# Patient Record
Sex: Female | Born: 1950 | Race: White | Hispanic: No | Marital: Married | Smoking: Never smoker
Health system: Southern US, Community
[De-identification: ages and names within clinical notes are randomized; demographics above are authoritative.]

## PROBLEM LIST (undated history)

## (undated) DIAGNOSIS — M1990 Unspecified osteoarthritis, unspecified site: Secondary | ICD-10-CM

## (undated) DIAGNOSIS — F329 Major depressive disorder, single episode, unspecified: Secondary | ICD-10-CM

## (undated) DIAGNOSIS — J189 Pneumonia, unspecified organism: Secondary | ICD-10-CM

## (undated) DIAGNOSIS — N8502 Endometrial intraepithelial neoplasia [EIN]: Secondary | ICD-10-CM

## (undated) DIAGNOSIS — K219 Gastro-esophageal reflux disease without esophagitis: Secondary | ICD-10-CM

## (undated) DIAGNOSIS — D219 Benign neoplasm of connective and other soft tissue, unspecified: Secondary | ICD-10-CM

## (undated) DIAGNOSIS — E785 Hyperlipidemia, unspecified: Secondary | ICD-10-CM

## (undated) DIAGNOSIS — K581 Irritable bowel syndrome with constipation: Secondary | ICD-10-CM

## (undated) DIAGNOSIS — E7800 Pure hypercholesterolemia, unspecified: Secondary | ICD-10-CM

## (undated) DIAGNOSIS — M359 Systemic involvement of connective tissue, unspecified: Secondary | ICD-10-CM

## (undated) DIAGNOSIS — F32A Depression, unspecified: Secondary | ICD-10-CM

## (undated) DIAGNOSIS — M5030 Other cervical disc degeneration, unspecified cervical region: Secondary | ICD-10-CM

## (undated) DIAGNOSIS — C801 Malignant (primary) neoplasm, unspecified: Secondary | ICD-10-CM

## (undated) DIAGNOSIS — M8589 Other specified disorders of bone density and structure, multiple sites: Secondary | ICD-10-CM

## (undated) DIAGNOSIS — H9190 Unspecified hearing loss, unspecified ear: Secondary | ICD-10-CM

## (undated) HISTORY — PX: TONSILLECTOMY: SUR1361

## (undated) HISTORY — PX: ESOPHAGOGASTRODUODENOSCOPY: SHX1529

## (undated) HISTORY — PX: ABDOMINAL HYSTERECTOMY: SHX81

## (undated) HISTORY — PX: COLONOSCOPY: SHX174

## (undated) HISTORY — PX: TUBAL LIGATION: SHX77

---

## 2005-02-11 ENCOUNTER — Ambulatory Visit: Payer: Self-pay | Admitting: Internal Medicine

## 2005-06-17 ENCOUNTER — Ambulatory Visit: Payer: Self-pay | Admitting: Gastroenterology

## 2006-03-21 ENCOUNTER — Ambulatory Visit: Payer: Self-pay | Admitting: Internal Medicine

## 2007-03-27 ENCOUNTER — Ambulatory Visit: Payer: Self-pay | Admitting: Family Medicine

## 2007-06-06 ENCOUNTER — Ambulatory Visit: Payer: Self-pay | Admitting: Orthopaedic Surgery

## 2008-03-28 ENCOUNTER — Ambulatory Visit: Payer: Self-pay | Admitting: Internal Medicine

## 2009-03-31 ENCOUNTER — Ambulatory Visit: Payer: Self-pay | Admitting: Family Medicine

## 2009-05-18 ENCOUNTER — Ambulatory Visit: Payer: Self-pay | Admitting: Internal Medicine

## 2009-07-08 ENCOUNTER — Ambulatory Visit: Payer: Self-pay | Admitting: Family Medicine

## 2010-04-06 ENCOUNTER — Ambulatory Visit: Payer: Self-pay | Admitting: Family Medicine

## 2010-06-29 ENCOUNTER — Ambulatory Visit: Payer: Self-pay | Admitting: Gastroenterology

## 2011-04-21 ENCOUNTER — Ambulatory Visit: Payer: Self-pay | Admitting: Family Medicine

## 2011-05-19 ENCOUNTER — Ambulatory Visit: Payer: Self-pay | Admitting: Gastroenterology

## 2012-04-12 ENCOUNTER — Ambulatory Visit: Payer: Self-pay | Admitting: Family Medicine

## 2012-04-26 ENCOUNTER — Ambulatory Visit: Payer: Self-pay | Admitting: Family Medicine

## 2012-09-20 HISTORY — PX: REPLACEMENT TOTAL KNEE: SUR1224

## 2012-09-20 HISTORY — PX: JOINT REPLACEMENT: SHX530

## 2012-10-30 ENCOUNTER — Ambulatory Visit: Payer: Self-pay | Admitting: General Practice

## 2012-10-30 LAB — SEDIMENTATION RATE: Erythrocyte Sed Rate: 10 mm/hr (ref 0–30)

## 2012-10-30 LAB — BASIC METABOLIC PANEL
BUN: 14 mg/dL (ref 7–18)
Calcium, Total: 8.9 mg/dL (ref 8.5–10.1)
Chloride: 106 mmol/L (ref 98–107)
Creatinine: 0.78 mg/dL (ref 0.60–1.30)
EGFR (African American): >60
EGFR (Non-African Amer.): >60
Glucose: 84 mg/dL (ref 65–99)
Sodium: 141 mmol/L (ref 136–145)

## 2012-10-30 LAB — CBC
HGB: 13.4 g/dL (ref 12.0–16.0)
MCHC: 34.2 g/dL (ref 32.0–36.0)
MCV: 92 fL (ref 80–100)
RBC: 4.28 10*6/uL (ref 3.80–5.20)
WBC: 5.3 10*3/uL (ref 3.6–11.0)

## 2012-10-30 LAB — URINALYSIS, COMPLETE
Bilirubin,UR: NEGATIVE
Leukocyte Esterase: NEGATIVE
Nitrite: NEGATIVE
Ph: 5 (ref 4.5–8.0)
Protein: NEGATIVE
Specific Gravity: 1.025 (ref 1.003–1.030)
Squamous Epithelial: <1
WBC UR: 1 /HPF (ref 0–5)

## 2012-10-30 LAB — PROTIME-INR: Prothrombin Time: 12.1 secs (ref 11.5–14.7)

## 2012-10-30 LAB — MRSA PCR SCREENING

## 2012-10-30 LAB — APTT: Activated PTT: 30 secs (ref 23.6–35.9)

## 2012-11-13 ENCOUNTER — Inpatient Hospital Stay: Payer: Self-pay | Admitting: General Practice

## 2012-11-14 LAB — BASIC METABOLIC PANEL
Anion Gap: 6 — ABNORMAL LOW (ref 7–16)
BUN: 13 mg/dL (ref 7–18)
Chloride: 107 mmol/L (ref 98–107)
EGFR (Non-African Amer.): >60
Glucose: 84 mg/dL (ref 65–99)

## 2012-11-14 LAB — PLATELET COUNT: Platelet: 172 10*3/uL (ref 150–440)

## 2012-11-15 LAB — BASIC METABOLIC PANEL
Anion Gap: 7 (ref 7–16)
BUN: 13 mg/dL (ref 7–18)
Chloride: 102 mmol/L (ref 98–107)
Co2: 26 mmol/L (ref 21–32)
Creatinine: 0.81 mg/dL (ref 0.60–1.30)
EGFR (Non-African Amer.): >60
Osmolality: 271 (ref 275–301)
Potassium: 4.6 mmol/L (ref 3.5–5.1)
Sodium: 135 mmol/L — ABNORMAL LOW (ref 136–145)

## 2012-11-15 LAB — PLATELET COUNT: Platelet: 201 10*3/uL (ref 150–440)

## 2013-04-27 ENCOUNTER — Ambulatory Visit: Payer: Self-pay | Admitting: Family Medicine

## 2014-05-01 ENCOUNTER — Ambulatory Visit: Payer: Self-pay | Admitting: Family Medicine

## 2014-06-28 DIAGNOSIS — K581 Irritable bowel syndrome with constipation: Secondary | ICD-10-CM | POA: Insufficient documentation

## 2015-01-10 NOTE — Discharge Summary (Signed)
PATIENT NAME:  Holly Mclaughlin, Holly Mclaughlin MR#:  [id number] DATE OF BIRTH:  09/26/1950  DATE OF ADMISSION:  11/13/2012 DATE OF DISCHARGE:  11/16/2012  ADMITTING DIAGNOSIS: Degenerative arthrosis of left knee.   DISCHARGE DIAGNOSES: Degenerative arthrosis of left knee.   HISTORY: The patient is a 64 year old female who has been followed at [facility] for progression of left knee pain. She had reported a long history of progressive knee pain. She did not recall any specific trauma or aggravating event. The patient had localized most of the pain along the medial aspect of the knee. Her pain was noted to be aggravated with weight-bearing activities. At the time of surgery, she was not using any ambulatory aid. The patient had not seen any significant improvement in her condition despite a series of Synvisc injections as well as Voltaren gel. X-rays taken in the [facility] showed significant degenerative changes with relative varus deformity. After discussion of the risks and benefits of surgical intervention, the patient expressed her understanding of the risks and benefits and agreed with plans for surgical intervention.   PROCEDURE: Left total knee arthroplasty using computer-assisted navigation.   ANESTHESIA: Femoral nerve block with spinal.   SOFT TISSUE RELEASE: Anterior cruciate ligament, posterior cruciate ligament, deep medial collateral ligaments, as well as the patellofemoral ligament.   IMPLANTS UTILIZED: DePuy PFC Sigma size 2.5 posterior stabilized femoral component (cemented), size 2.5 MBT tibial component (cemented), 32 mm three pegged oval dome patella (cemented), and a 10 mm stabilized rotating platform polyethylene insert.   HOSPITAL COURSE: The patient tolerated the procedure very well. She had no complications. She was then taken to PAC-U where she was stabilized and then transferred to the orthopedic floor. She began receiving anticoagulation therapy of Lovenox 30 mg sub-Q every 12  hours per anesthesia and pharmacy protocol. She was fitted with TED stockings bilaterally. These were allowed to be removed 1 hour per 8 hour shift. The left one was applied on day 2 following removal of the Hemovac and dressing change. The patient was also fitted with the AV-I compression foot pumps bilaterally set at 80 mmHg. There has been no evidence of any DVTs. Calves have been nontender. Negative Homans sign. Heels were elevated off the bed using rolled towels. No tissue breakdown was noted.   The patient has denied any chest pains or shortness of breath. Vital signs have been stable. She has been afebrile. Hemodynamically she was stable and no transfusions were given other than the Autovac transfusion given the first 6 hours postoperatively.   Physical therapy was initiated on day 1 for gait training and transfers. She has done very well. Upon being discharged was ambulating greater than 200 feet. She was able to go up and down 4 sets of steps. She was independent with bed to chair transfers. Occupational therapy was also initiated on day 1 for ADL and assistive devices.   The patient's IV, Foley and Hemovac were discontinued on day 2 along with a dressing change. The Polar Care was reapplied to the surgical leg maintaining a temperature of 40 to 50 degrees Fahrenheit. The wound was free of any drainage or any signs of infection.   DISPOSITION: The patient is discharged to home in improved stable condition.   DISCHARGE INSTRUCTIONS: 1.  She may continue to weight bear as tolerated. Continue using a walker until cleared by physical therapy to go to a quad cane. She will receive home PT.  2.  Continue with TED stockings. These are to be  worn during the day but may be removed at night.  3.  Continue with the Polar Care maintaining a temperature of 40 to 50 degrees Fahrenheit pretty much around-the-clock for the first 2 weeks.  4.  She has a followup appointment in the clinic in 2 weeks. She is to  call the clinic sooner if any temperatures of 101.5 or greater or excessive bleeding.  5.  She is placed on a regular diet.  6.  She is to resume her regular medication that she was on prior to admission. She was given a prescription for oxycodone 5 to 10 mg q. 4 to 6 hours p.r.n. for pain, tramadol 50 to 100 mg q. 4 to 6 hours p.r.n. for pain, Lovenox 40 mg sub-Q daily for 14 days and then discontinue and begin taking one 81 mg enteric-coated aspirin.   PAST MEDICAL HISTORY: 1.  Chemotherapy. 2.  Depression.    3.  Hypercholesterolemia.  4.  Arthritis.  ____________________________ Vance Peper, PA jrw:[id number] D: 11/16/2012 07:25:06 ET T: 11/16/2012 07:37:17 ET JOB#: [id number]  cc: Vance Peper, PA, <Dictator> Corey Caulfield PA ELECTRONICALLY SIGNED 11/16/2012 21:08

## 2015-01-10 NOTE — Op Note (Signed)
PATIENT NAME:  Holly Mclaughlin, Holly Mclaughlin MR#:  [id number] DATE OF BIRTH:  [birth year]  DATE OF PROCEDURE:  11/13/2012  PREOPERATIVE DIAGNOSIS: Degenerative arthrosis of the left knee.   POSTOPERATIVE DIAGNOSIS: Degenerative arthrosis of the left knee.   PROCEDURE PERFORMED: Left total knee arthroplasty using computer-assisted navigation.   SURGEON: Dr. Skip Estimable.   ASSISTANT: Vance Peper, PA.   ANESTHESIA: Femoral nerve block, and spinal.   ESTIMATED BLOOD LOSS: 50 mL.   FLUIDS REPLACED: 2000 mL of crystalloid.   TOURNIQUET TIME: 80 minutes.   DRAINS: Two medium drains to reinfusion system.   SOFT TISSUE RELEASES: Anterior cruciate ligament, posterior cruciate ligament, deep medial collateral ligament, patellofemoral ligament.   IMPLANTS UTILIZED: DePuy PFC Sigma size 2.5 posterior-stabilized femoral component (size 2.5 MBT tibial component) (cemented), 32-mm three-pegged oval-dome patella (cemented), and a 10-mm stabilized rotating platform polyethylene insert.   INDICATIONS FOR SURGERY: The patient is a 64 year old female who has been seen for complaints of progressive left knee pain. X-rays demonstrated significant degenerative changes with relative varus deformity. The patient has not seen any significant improvement despite conservative nonsurgical intervention.   After a discussion of the risks and benefits of surgical intervention, the patient expressed understanding of the risks and benefits and agreed with plans for surgical intervention.   PROCEDURE IN DETAIL: The patient was brought into the operating room, then after adequate femoral nerve block and spinal anesthesia was achieved, a tourniquet was placed on the patient's upper left thigh. The patient's left knee and leg were cleaned and prepped with alcohol and DuraPrep, draped in the usual sterile fashion. A "timeout" was performed as per usual protocol. The left lower extremity was exsanguinated using an Esmarch, and the tourniquet  was inflated to 300 mmHg. An anterior longitudinal incision was made, followed by a standard mid-vastus approach. A moderate effusion was evacuated. The deep fibers of the medial collateral ligament were elevated in a subperiosteal fashion off the medial flare of the tibia so as to maintain a continuous soft tissue sleeve. The patella was subluxed laterally, and the patellofemoral ligament was incised. Inspection of the knee demonstrated severe degenerative changes with full-thickness loss of articular cartilage to the medial compartment. Prominent osteophytes were debrided using a rongeur. Anterior and posterior cruciate ligaments were excised.   Two 4.0-mm  Schanz pins were inserted into the femur and into the tibia for attachment of the array of trackers used for computer-assisted navigation. Hip center was identified using circumduction technique. Distal landmarks were mapped using the computer. The distal femur and proximal tibia were mapped using the computer. Distal femoral cutting guide was positioned using computer-assisted navigation so as to achieve a 5 degree distal valgus cut. Cut was performed and verified using the computer. The distal femur was sized and it was felt that a size 2.5 femoral component was appropriate. A size 2.5 cutting guide was positioned and an anterior cut was performed and verified using the computer. This was followed by completion of the posterior and chamfer cuts. The femoral cutting guide for the central box was then positioned, and the central box cut was performed.   Attention was then directed to the proximal tibia. Medial and lateral menisci were excised. The extramedullary tibial cutting guide was positioned using computer-assisted navigation so as to achieve 0 degrees varus-valgus alignment and 0 degree posterior slope. Cut was performed and verified using the computer. The proximal tibia was sized, and it was felt that a size 2.5 tibial tray was appropriate. Tibial  and  femoral trials were inserted, followed by insertion of a 10-mm  polyethylene insert. Good medial and lateral soft tissue balancing was appreciated both in full extension and in flexion.   Finally, the patella was cut and prepared so as to accommodate a 32-mm  three-peg oval dome patella. A patellar trial was placed and the knee was placed through a range of motion, with excellent patellar tracking appreciated.   Femoral component was removed. Central post hole for the tibial component was reamed, followed by insertion of a keel punch. The tibial tray was then removed. The cut surfaces of bone were irrigated with copious amounts of normal saline with antibiotic solution using pulsatile lavage and then suctioned dry. Poly (methyl) (methacrylate) cement was prepared in the usual fashion using a vacuum mixer. Cement was applied to the cut surface of the proximal tibia as well as along the undersurface of a size 2.5 MBT tibial component. The tibial component was positioned and impacted into place. Excess cement was removed using [employer] fast streamer. Cement was then applied to the cut surface of the femur as well as along the posterior flanges of a size 2.5 posterior-stabilized femoral component. Femoral component was positioned and impacted into place. Excess cement was removed using [employer] fast streamer. A 10-mm  polyethylene trial was inserted and the knee was brought in full extension with steady axial compression applied. Finally, cement was applied to the backside of a 32-mm three-peg oval dome patella, and the patellar component was positioned and patellar clamp applied. Excess cement was removed using [employer] fast streamer.   After adequate curing of cement, the tourniquet was deflated after a total tourniquet time of  80 minutes. Hemostasis was achieved using electrocautery. The knee was irrigated with copious amounts of normal saline with antibiotic solution using pulsatile lavage, and then suctioned dry. The knee  was inspected for any residual cement debris. A 30 mL of 0.25% Marcaine with epinephrine was injected along the posterior capsule. A 10-mm stabilized rotating-platform polyethylene insert was inserted and the knee was placed through a range of motion. Excellent patellar tracking was appreciated. Excellent medial and lateral soft tissue balancing was noted.   Two medium drains were placed in the wound bed and brought out through a separate stab incision to be attached to a reinfusion system. The medial parapatellar portion of the incision was reapproximated using interrupted sutures of #1 Vicryl. The subcutaneous tissue was approximated in layers using, first, #0 Vicryl followed by #2-0 Vicryl. Skin was closed with skin staples. A sterile dressing was applied.   The patient tolerated the procedure well. She was transported to the recovery room in stable condition.     ____________________________ Laurice Record. Holley Bouche., MD jph:dm D: 11/13/2012 11:38:00 ET T: 11/13/2012 12:56:14 ET JOB#: [id number]  cc: Jeneen Rinks P. Holley Bouche., MD, <Dictator> JAMES P Holley Bouche MD ELECTRONICALLY SIGNED 11/14/2012 [phone]

## 2015-05-22 ENCOUNTER — Other Ambulatory Visit: Payer: Self-pay | Admitting: Family Medicine

## 2015-05-22 DIAGNOSIS — Z1231 Encounter for screening mammogram for malignant neoplasm of breast: Secondary | ICD-10-CM

## 2015-06-12 ENCOUNTER — Ambulatory Visit
Admission: RE | Admit: 2015-06-12 | Discharge: 2015-06-12 | Disposition: A | Discharge status: Home / Self Care | Payer: BLUE CROSS/BLUE SHIELD | Source: Ambulatory Visit | Attending: Family Medicine | Admitting: Family Medicine

## 2015-06-12 DIAGNOSIS — R928 Other abnormal and inconclusive findings on diagnostic imaging of breast: Secondary | ICD-10-CM | POA: Diagnosis not present

## 2015-06-12 DIAGNOSIS — Z1231 Encounter for screening mammogram for malignant neoplasm of breast: Secondary | ICD-10-CM | POA: Diagnosis present

## 2015-06-18 ENCOUNTER — Other Ambulatory Visit: Payer: Self-pay | Admitting: Family Medicine

## 2015-06-18 DIAGNOSIS — N6489 Other specified disorders of breast: Secondary | ICD-10-CM

## 2015-06-26 ENCOUNTER — Ambulatory Visit
Admission: RE | Admit: 2015-06-26 | Discharge: 2015-06-26 | Disposition: A | Discharge status: Home / Self Care | Payer: BLUE CROSS/BLUE SHIELD | Source: Ambulatory Visit | Attending: Family Medicine | Admitting: Family Medicine

## 2015-06-26 DIAGNOSIS — N6489 Other specified disorders of breast: Secondary | ICD-10-CM | POA: Diagnosis present

## 2015-06-26 DIAGNOSIS — R928 Other abnormal and inconclusive findings on diagnostic imaging of breast: Secondary | ICD-10-CM | POA: Insufficient documentation

## 2015-07-17 ENCOUNTER — Ambulatory Visit
Admission: EM | Admit: 2015-07-17 | Discharge: 2015-07-17 | Disposition: A | Discharge status: Home / Self Care | Payer: BLUE CROSS/BLUE SHIELD | Attending: Family Medicine | Admitting: Family Medicine

## 2015-07-17 DIAGNOSIS — R319 Hematuria, unspecified: Secondary | ICD-10-CM | POA: Diagnosis not present

## 2015-07-17 DIAGNOSIS — N390 Urinary tract infection, site not specified: Secondary | ICD-10-CM

## 2015-07-17 HISTORY — DX: Gastro-esophageal reflux disease without esophagitis: K21.9

## 2015-07-17 LAB — URINALYSIS COMPLETE WITH MICROSCOPIC (ARMC ONLY)
Glucose, UA: NEGATIVE mg/dL
Nitrite: NEGATIVE
PH: 5.5 (ref 5.0–8.0)
SQUAMOUS EPITHELIAL / LPF: NONE SEEN — AB
Specific Gravity, Urine: >1.03 — ABNORMAL HIGH (ref 1.005–1.030)

## 2015-07-17 MED ORDER — PHENAZOPYRIDINE HCL 200 MG PO TABS: 200.0000 mg | ORAL_TABLET | Freq: Three times a day (TID) | ORAL | Status: DC | PRN

## 2015-07-17 MED ORDER — CIPROFLOXACIN HCL 500 MG PO TABS: 500.0000 mg | ORAL_TABLET | Freq: Two times a day (BID) | ORAL | Status: DC

## 2015-07-17 NOTE — ED Notes (Signed)
Pt states "This evening I noticed blood in my urine with stinging."

## 2015-07-17 NOTE — ED Provider Notes (Signed)
CSN: [id number]     Arrival date & time 07/17/15  1644 History   First MD Initiated Contact with Patient 07/17/15 1727    Nurses notes were reviewed. Chief Complaint  Patient presents with  . Hematuria   patient states she started having burning on urination after taking her mother-in-law to her doctor's appointment. Not only she have dysuria but she noticed blood in the urine as well. She does get urinary tract infections often but she has had them before and this is how they felt. She denies any pain except when she is urinating. Denies dyspareunia, discharge or vaginal bleeding. Surgeries tubal ligation and knee replacement surgery. (Consider location/radiation/quality/duration/timing/severity/associated sxs/prior Treatment) Patient is a 64 y.o. female presenting with hematuria. The history is provided by the patient. No language interpreter was used.  Hematuria This is a new problem. The current episode started more than 1 week ago. The problem has been gradually worsening. Associated symptoms include abdominal pain. Pertinent negatives include no chest pain and no headaches. Nothing aggravates the symptoms. Nothing relieves the symptoms. The treatment provided no relief.   Patient never smoked. Past Medical History  Diagnosis Date  . GERD (gastroesophageal reflux disease)    Past Surgical History  Procedure Laterality Date  . Joint replacement     Family History  Problem Relation Age of Onset  . Breast cancer Paternal Aunt 53  . Breast cancer Paternal Grandmother 29  . Breast cancer Cousin     2 pat cousins   Social History  Substance Use Topics  . Smoking status: Never Smoker   . Smokeless tobacco: None  . Alcohol Use: Yes     Comment: social   OB History    No data available     Review of Systems  Cardiovascular: Negative for chest pain.  Gastrointestinal: Positive for abdominal pain.  Genitourinary: Positive for dysuria and hematuria. Negative for flank pain,  enuresis and dyspareunia.  Musculoskeletal: Negative.   Neurological: Negative for headaches.    Allergies  Review of patient's allergies indicates not on file.  Home Medications   Prior to Admission medications   Medication Sig Start Date End Date Taking? Authorizing Provider  ciprofloxacin (CIPRO) 500 MG tablet Take 1 tablet (500 mg total) by mouth 2 (two) times daily. 07/17/15   Frederich Cha, MD  phenazopyridine (PYRIDIUM) 200 MG tablet Take 1 tablet (200 mg total) by mouth 3 (three) times daily as needed for pain. 07/17/15   Frederich Cha, MD   Meds Ordered and Administered this Visit  Medications - No data to display  BP 132/57 mmHg  Pulse 72  Temp(Src) 97.7 F (36.5 C) (Tympanic)  Resp 16  Ht 5' (1.524 m)  Wt 182 lb (82.555 kg)  BMI 35.54 kg/m2  SpO2 100% No data found.   Physical Exam  Constitutional: She is oriented to person, place, and time. She appears well-developed and well-nourished.  HENT:  Head: Normocephalic and atraumatic.  Right Ear: External ear normal.  Left Ear: External ear normal.  Eyes: Pupils are equal, round, and reactive to light.  Neck: Neck supple.  Abdominal: Soft. Bowel sounds are normal. She exhibits no distension. There is no hepatosplenomegaly. There is tenderness in the suprapubic area. There is no rebound and no CVA tenderness. No hernia.    Musculoskeletal: Normal range of motion. She exhibits no edema.  Neurological: She is alert and oriented to person, place, and time.  Skin: Skin is warm, dry and intact. No erythema.  Psychiatric: She has  a normal mood and affect. Her speech is normal and behavior is normal. Her mood appears not anxious. Cognition and memory are normal. Cognition and memory are not impaired.  Vitals reviewed.   ED Course  Procedures (including critical care time)  Labs Review Labs Reviewed  URINALYSIS COMPLETEWITH MICROSCOPIC ([facility] ONLY) - Abnormal; Notable for the following:    Color, Urine AMBER (*)     APPearance CLOUDY (*)    Bilirubin Urine 1+ (*)    Ketones, ur TRACE (*)    Specific Gravity, Urine >1.030 (*)    Hgb urine dipstick 3+ (*)    Protein, ur >300 (*)    Leukocytes, UA 3+ (*)    Bacteria, UA MANY (*)    Squamous Epithelial / LPF NONE SEEN (*)    All other components within normal limits  URINE CULTURE    Imaging Review No results found.   Visual Acuity Review  Right Eye Distance:   Left Eye Distance:   Bilateral Distance:    Right Eye Near:   Left Eye Near:    Bilateral Near:      Results for orders placed or performed during the hospital encounter of 07/17/15  Urinalysis complete, with microscopic  Result Value Ref Range   Color, Urine AMBER (A) YELLOW   APPearance CLOUDY (A) CLEAR   Glucose, UA NEGATIVE NEGATIVE mg/dL   Bilirubin Urine 1+ (A) NEGATIVE   Ketones, ur TRACE (A) NEGATIVE mg/dL   Specific Gravity, Urine >1.030 (H) 1.005 - 1.030   Hgb urine dipstick 3+ (A) NEGATIVE   pH 5.5 5.0 - 8.0   Protein, ur >300 (A) NEGATIVE mg/dL   Nitrite NEGATIVE NEGATIVE   Leukocytes, UA 3+ (A) NEGATIVE   RBC / HPF TOO NUMEROUS TO COUNT <3 RBC/hpf   WBC, UA TOO NUMEROUS TO COUNT <3 WBC/hpf   Bacteria, UA MANY (A) RARE   Squamous Epithelial / LPF NONE SEEN (A) RARE    MDM   1. Hematuria   2. UTI (lower urinary tract infection)     I saw on Cipro 500 mg twice a day for 7 days and Pyridium 500 mg one by mouth 3 times a day for the next 5 days. I did recommend going to doctor in 2-3 weeks for follow-up to make sure that the hematuria has cleared up.   Frederich Cha, MD 07/17/15 [phone]

## 2015-07-17 NOTE — Discharge Instructions (Signed)
Hematuria, Adult Hematuria is blood in your urine. It can be caused by a bladder infection, kidney infection, prostate infection, kidney stone, or cancer of your urinary tract. Infections can usually be treated with medicine, and a kidney stone usually will pass through your urine. If neither of these is the cause of your hematuria, further workup to find out the reason may be needed. It is very important that you tell your health care provider about any blood you see in your urine, even if the blood stops without treatment or happens without causing pain. Blood in your urine that happens and then stops and then happens again can be a symptom of a very serious condition. Also, pain is not a symptom in the initial stages of many urinary cancers. HOME CARE INSTRUCTIONS   Drink lots of fluid, 3-4 quarts a day. If you have been diagnosed with an infection, cranberry juice is especially recommended, in addition to large amounts of water.  Avoid caffeine, tea, and carbonated beverages because they tend to irritate the bladder.  Avoid alcohol because it may irritate the prostate.  Take all medicines as directed by your health care provider.  If you were prescribed an antibiotic medicine, finish it all even if you start to feel better.  If you have been diagnosed with a kidney stone, follow your health care provider's instructions regarding straining your urine to catch the stone.  Empty your bladder often. Avoid holding urine for long periods of time.  After a bowel movement, women should cleanse front to back. Use each tissue only once.  Empty your bladder before and after sexual intercourse if you are a female. SEEK MEDICAL CARE IF:  You develop back pain.  You have a fever.  You have a feeling of sickness in your stomach (nausea) or vomiting.  Your symptoms are not better in 3 days. Return sooner if you are getting worse. SEEK IMMEDIATE MEDICAL CARE IF:   You develop severe vomiting and  are unable to keep the medicine down.  You develop severe back or abdominal pain despite taking your medicines.  You begin passing a large amount of blood or clots in your urine.  You feel extremely weak or faint, or you pass out. MAKE SURE YOU:   Understand these instructions.  Will watch your condition.  Will get help right away if you are not doing well or get worse.   This information is not intended to replace advice given to you by your health care provider. Make sure you discuss any questions you have with your health care provider.   Document Released: 09/06/2005 Document Revised: 09/27/2014 Document Reviewed: 05/07/2013 Elsevier Interactive Patient Education 2016 [employer]  Urinary Tract Infection A urinary tract infection (UTI) can occur any place along the urinary tract. The tract includes the kidneys, ureters, bladder, and urethra. A type of germ called bacteria often causes a UTI. UTIs are often helped with antibiotic medicine.  HOME CARE   If given, take antibiotics as told by your doctor. Finish them even if you start to feel better.  Drink enough fluids to keep your pee (urine) clear or pale yellow.  Avoid tea, drinks with caffeine, and bubbly (carbonated) drinks.  Pee often. Avoid holding your pee in for a long time.  Pee before and after having sex (intercourse).  Wipe from front to back after you poop (bowel movement) if you are a woman. Use each tissue only once. GET HELP RIGHT AWAY IF:   You have  back pain.  You have lower belly (abdominal) pain.  You have chills.  You feel sick to your stomach (nauseous).  You throw up (vomit).  Your burning or discomfort with peeing does not go away.  You have a fever.  Your symptoms are not better in 3 days. MAKE SURE YOU:   Understand these instructions.  Will watch your condition.  Will get help right away if you are not doing well or get worse.   This information is not intended to replace  advice given to you by your health care provider. Make sure you discuss any questions you have with your health care provider.   Document Released: 02/23/2008 Document Revised: 09/27/2014 Document Reviewed: 04/06/2012 Elsevier Interactive Patient Education [employer].

## 2015-07-20 LAB — URINE CULTURE

## 2016-05-28 ENCOUNTER — Other Ambulatory Visit: Payer: Self-pay | Admitting: Family Medicine

## 2016-05-28 DIAGNOSIS — Z1231 Encounter for screening mammogram for malignant neoplasm of breast: Secondary | ICD-10-CM

## 2016-06-17 ENCOUNTER — Ambulatory Visit: Payer: BLUE CROSS/BLUE SHIELD

## 2016-06-21 ENCOUNTER — Ambulatory Visit
Admission: RE | Admit: 2016-06-21 | Discharge: 2016-06-21 | Disposition: A | Discharge status: Home / Self Care | Payer: BLUE CROSS/BLUE SHIELD | Source: Ambulatory Visit | Attending: Family Medicine | Admitting: Family Medicine

## 2016-06-21 DIAGNOSIS — Z1231 Encounter for screening mammogram for malignant neoplasm of breast: Secondary | ICD-10-CM | POA: Insufficient documentation

## 2016-08-28 DIAGNOSIS — M5030 Other cervical disc degeneration, unspecified cervical region: Secondary | ICD-10-CM | POA: Insufficient documentation

## 2017-05-17 ENCOUNTER — Other Ambulatory Visit: Payer: Self-pay | Admitting: Family Medicine

## 2017-05-17 DIAGNOSIS — Z1231 Encounter for screening mammogram for malignant neoplasm of breast: Secondary | ICD-10-CM

## 2017-07-21 ENCOUNTER — Ambulatory Visit
Admission: RE | Admit: 2017-07-21 | Discharge: 2017-07-21 | Disposition: A | Discharge status: Home / Self Care | Payer: BLUE CROSS/BLUE SHIELD | Source: Ambulatory Visit | Attending: Family Medicine | Admitting: Family Medicine

## 2017-07-21 DIAGNOSIS — Z1231 Encounter for screening mammogram for malignant neoplasm of breast: Secondary | ICD-10-CM

## 2017-09-02 ENCOUNTER — Other Ambulatory Visit: Payer: Self-pay | Admitting: Family Medicine

## 2017-09-02 DIAGNOSIS — Z13820 Encounter for screening for osteoporosis: Secondary | ICD-10-CM

## 2018-03-08 ENCOUNTER — Ambulatory Visit
Admission: RE | Admit: 2018-03-08 | Discharge: 2018-03-08 | Disposition: A | Discharge status: Home / Self Care | Payer: Medicare Other | Source: Ambulatory Visit | Attending: Family Medicine | Admitting: Family Medicine

## 2018-03-08 DIAGNOSIS — Z13820 Encounter for screening for osteoporosis: Secondary | ICD-10-CM | POA: Diagnosis not present

## 2018-03-08 DIAGNOSIS — M810 Age-related osteoporosis without current pathological fracture: Secondary | ICD-10-CM | POA: Insufficient documentation

## 2018-06-29 ENCOUNTER — Other Ambulatory Visit: Payer: Self-pay | Admitting: Family Medicine

## 2018-06-29 DIAGNOSIS — Z1231 Encounter for screening mammogram for malignant neoplasm of breast: Secondary | ICD-10-CM

## 2018-07-06 ENCOUNTER — Other Ambulatory Visit
Admission: RE | Admit: 2018-07-06 | Discharge: 2018-07-06 | Disposition: A | Discharge status: Home / Self Care | Payer: Medicare Other | Source: Ambulatory Visit | Attending: Internal Medicine | Admitting: Internal Medicine

## 2018-07-06 ENCOUNTER — Other Ambulatory Visit: Payer: Self-pay | Admitting: Internal Medicine

## 2018-07-06 ENCOUNTER — Ambulatory Visit
Admission: RE | Admit: 2018-07-06 | Discharge: 2018-07-06 | Disposition: A | Discharge status: Home / Self Care | Payer: Medicare Other | Source: Ambulatory Visit | Attending: Internal Medicine | Admitting: Internal Medicine

## 2018-07-06 DIAGNOSIS — R310 Gross hematuria: Secondary | ICD-10-CM | POA: Diagnosis not present

## 2018-07-06 DIAGNOSIS — D259 Leiomyoma of uterus, unspecified: Secondary | ICD-10-CM | POA: Insufficient documentation

## 2018-07-06 DIAGNOSIS — I700 Atherosclerosis of aorta: Secondary | ICD-10-CM | POA: Diagnosis not present

## 2018-07-06 HISTORY — DX: Systemic involvement of connective tissue, unspecified: M35.9

## 2018-07-06 LAB — CREATININE, SERUM
CREATININE: 0.79 mg/dL (ref 0.44–1.00)
GFR calc Af Amer: >60 mL/min (ref >60)
GFR calc non Af Amer: >60 mL/min (ref >60)

## 2018-07-06 MED ORDER — IOHEXOL 300 MG/ML  SOLN
150.0000 mL | Freq: Once | INTRAMUSCULAR | Status: AC | PRN
  Administered 2018-07-06: 125 mL via INTRAVENOUS

## 2018-07-10 ENCOUNTER — Other Ambulatory Visit: Payer: Self-pay | Admitting: Internal Medicine

## 2018-07-10 DIAGNOSIS — R310 Gross hematuria: Secondary | ICD-10-CM

## 2018-07-12 ENCOUNTER — Other Ambulatory Visit: Payer: Medicare Other

## 2018-07-24 ENCOUNTER — Ambulatory Visit
Admission: RE | Admit: 2018-07-24 | Discharge: 2018-07-24 | Disposition: A | Discharge status: Home / Self Care | Payer: Medicare Other | Source: Ambulatory Visit | Attending: Family Medicine | Admitting: Family Medicine

## 2018-07-24 DIAGNOSIS — Z1231 Encounter for screening mammogram for malignant neoplasm of breast: Secondary | ICD-10-CM | POA: Insufficient documentation

## 2018-07-26 NOTE — H&P (Signed)
[name] is a 67 y.o. female here for Fractional D+C , H/S , myosure resection of polyp  .consult from Angie Fava, PA  for a 4 week h/o of hematuria and lower abd pain Pain has recently dissipated .She was treated for 3 days with ABX  Ctscan showed small calcified fibroid and possible cervical cyst  Pap 2 yr ago -neg and neg HR HPV  Remote h/o cervical cryotherapy  G0 u/s : 07/13/18 Uterus anteverted  Fibroid seen:Lt posterior (calcified)=1.6cm  Fluid filled endometrium=8.16mm; walls=0.13 + 0.20cm=0.33cm (3.5mm)  2 endometrial polyps=1)1.47 x 0.71 x 1.54cm      2)1.02 x 0.81 x 1.20cm  Nabothian cyst: complex with septation & internal vascularity=1.84 x 1.07 x 2.12cm; septation=0.26cm  No free fluid seen  B/L ovaries appear wnl  Past Medical History:  has a past medical history of Arthritis, Depression, Hearing loss, central, Hyperlipidemia, and Irritable bowel syndrome with constipation (06/28/2014).  Past Surgical History:  has a past surgical history that includes Tonsillectomy; Tubal ligation; Replacement total knee (Left, 2014); Colonoscopy (06/29/2010); egd (04/14/2011); and Colonoscopy (09/09/2015). Family History: family history includes Angina in her father; Coronary Artery Disease (Blocked arteries around heart) in her father, mother, and paternal grandmother; Diabetes type II in her mother; Heart disease in her mother and paternal grandmother; Ovarian cancer in her cousin; Stroke in her father. Social History:  reports that she has never smoked. She has never used smokeless tobacco. She reports current alcohol use. She reports that she does not use drugs. OB/GYN History:  OB History   No obstetric history on file.     Allergies: is allergic to ativan [lorazepam]. Medications:  Current Outpatient Medications:  .  acetaminophen (TYLENOL) 650 MG ER tablet, Take 650 mg by mouth every 8 (eight) hours as needed.  , Disp: , Rfl:  .  atorvastatin (LIPITOR) 20  MG tablet, Take 1 tablet (20 mg total) by mouth once daily, Disp: 90 tablet, Rfl: 3 .  citalopram (CELEXA) 20 MG tablet, Take 1 tablet (20 mg total) by mouth once daily, Disp: 90 tablet, Rfl: 3 .  cyclobenzaprine (FLEXERIL) 5 MG tablet, 1 po bid prn for muscle spasms. Not for daily use., Disp: 30 tablet, Rfl: 2 .  dextromethorphan polistirex (DELSYM) 30 mg/5 mL liquid, Take by mouth 2 (two) times daily., Disp: , Rfl:  .  docusate (COLACE) 100 MG capsule, , Disp: , Rfl:  .  FEXOFENADINE/PSEUDOEPHEDRINE (ALLERGY RELIEF D ORAL), Take by mouth., Disp: , Rfl:  .  ranitidine (ZANTAC) 150 MG capsule, Take 150 mg by mouth as needed for Heartburn., Disp: , Rfl:  .  SIMETHICONE (PHAZYME ORAL), Take 180 mg by mouth as needed., Disp: , Rfl:  .  buPROPion (WELLBUTRIN XL) 150 MG XL tablet, Take 1 tablet (150 mg total) by mouth once daily, Disp: 90 tablet, Rfl: 3  Review of Systems: General:                      No fatigue or weight loss Eyes:                           No vision changes Ears:                            No hearing difficulty Respiratory:                No cough or shortness of  breath Pulmonary:                  No asthma or shortness of breath Cardiovascular:           No chest pain, palpitations, dyspnea on exertion Gastrointestinal:          No abdominal bloating, chronic diarrhea, constipations, masses, pain or hematochezia Genitourinary:             No hematuria, dysuria, abnormal vaginal discharge, pelvic pain, Menometrorrhagia Lymphatic:                   No swollen lymph nodes Musculoskeletal:         No muscle weakness Neurologic:                  No extremity weakness, syncope, seizure disorder Psychiatric:                  No history of depression, delusions or suicidal/homicidal ideation    Exam:      Vitals:   07/13/18 1326  BP: 148/81  Pulse: 74    Body mass index is 38.04 kg/m.  WDWN white/  female in NAD   Lungs: CTA  CV : RRR without murmur    Neck:   no thyromegaly Abdomen: soft , no mass, normal active bowel sounds,  non-tender, no rebound tenderness Pelvic: tanner stage 5 ,  External genitalia: vulva /labia no lesions Urethra: no prolapse Vagina: normal physiologic d/c Cervix: no lesions, no cervical motion tenderness , stenotic cx . Possible small nabothian cyst at 2 oclock  Uterus: normal size shape and contour, non-tender Adnexa: no mass,  non-tender   Rectovaginal:   No CVAT  Impression:   The primary encounter diagnosis was Fibroid. Diagnoses of Lower abdominal pain and Gross hematuria were also pertinent to this visit. Endometrial polyps Probable benign fibroids and Nabothian cervical cyst .  Plan:  Fractional D+C , H/S , possible myosure of endometrial polyps   Risks discussed

## 2018-07-27 ENCOUNTER — Ambulatory Visit (INDEPENDENT_AMBULATORY_CARE_PROVIDER_SITE_OTHER): Payer: Medicare Other | Admitting: Nurse Practitioner

## 2018-07-27 ENCOUNTER — Ambulatory Visit: Payer: Self-pay | Admitting: Urology

## 2018-07-27 ENCOUNTER — Encounter (INDEPENDENT_AMBULATORY_CARE_PROVIDER_SITE_OTHER): Payer: Medicare Other | Admitting: Nurse Practitioner

## 2018-07-27 ENCOUNTER — Encounter (INDEPENDENT_AMBULATORY_CARE_PROVIDER_SITE_OTHER): Payer: Self-pay | Admitting: Nurse Practitioner

## 2018-07-27 VITALS — BP 155/78 | HR 78 | Resp 16 | Ht 59.0 in | Wt 179.8 lb

## 2018-07-27 DIAGNOSIS — M1990 Unspecified osteoarthritis, unspecified site: Secondary | ICD-10-CM | POA: Diagnosis not present

## 2018-07-27 DIAGNOSIS — E7800 Pure hypercholesterolemia, unspecified: Secondary | ICD-10-CM | POA: Diagnosis not present

## 2018-07-27 DIAGNOSIS — I700 Atherosclerosis of aorta: Secondary | ICD-10-CM

## 2018-07-28 ENCOUNTER — Ambulatory Visit (INDEPENDENT_AMBULATORY_CARE_PROVIDER_SITE_OTHER): Payer: Medicare Other | Admitting: Nurse Practitioner

## 2018-07-28 ENCOUNTER — Other Ambulatory Visit (INDEPENDENT_AMBULATORY_CARE_PROVIDER_SITE_OTHER): Payer: Self-pay | Admitting: Nurse Practitioner

## 2018-07-28 ENCOUNTER — Ambulatory Visit (INDEPENDENT_AMBULATORY_CARE_PROVIDER_SITE_OTHER): Payer: Medicare Other

## 2018-07-28 DIAGNOSIS — M79604 Pain in right leg: Secondary | ICD-10-CM

## 2018-07-28 DIAGNOSIS — M79605 Pain in left leg: Principal | ICD-10-CM

## 2018-07-31 ENCOUNTER — Ambulatory Visit (INDEPENDENT_AMBULATORY_CARE_PROVIDER_SITE_OTHER): Payer: Medicare Other

## 2018-07-31 ENCOUNTER — Ambulatory Visit (INDEPENDENT_AMBULATORY_CARE_PROVIDER_SITE_OTHER): Payer: Medicare Other | Admitting: Nurse Practitioner

## 2018-07-31 VITALS — BP 155/83 | HR 76 | Resp 16 | Ht 59.0 in | Wt 179.6 lb

## 2018-07-31 DIAGNOSIS — M1990 Unspecified osteoarthritis, unspecified site: Secondary | ICD-10-CM | POA: Diagnosis not present

## 2018-07-31 DIAGNOSIS — I700 Atherosclerosis of aorta: Secondary | ICD-10-CM | POA: Diagnosis not present

## 2018-07-31 DIAGNOSIS — M79604 Pain in right leg: Secondary | ICD-10-CM | POA: Diagnosis not present

## 2018-07-31 DIAGNOSIS — E7800 Pure hypercholesterolemia, unspecified: Secondary | ICD-10-CM | POA: Diagnosis not present

## 2018-07-31 DIAGNOSIS — M79605 Pain in left leg: Secondary | ICD-10-CM | POA: Diagnosis not present

## 2018-08-01 ENCOUNTER — Encounter (INDEPENDENT_AMBULATORY_CARE_PROVIDER_SITE_OTHER): Payer: Self-pay | Admitting: Nurse Practitioner

## 2018-08-01 DIAGNOSIS — E7800 Pure hypercholesterolemia, unspecified: Secondary | ICD-10-CM | POA: Insufficient documentation

## 2018-08-01 DIAGNOSIS — I700 Atherosclerosis of aorta: Secondary | ICD-10-CM | POA: Insufficient documentation

## 2018-08-01 DIAGNOSIS — M1990 Unspecified osteoarthritis, unspecified site: Secondary | ICD-10-CM | POA: Insufficient documentation

## 2018-08-01 NOTE — Progress Notes (Signed)
Subjective:    Patient ID: Holly Mclaughlin, female    DOB: November 26, 1950, 67 y.o.   MRN: [id number] Chief Complaint  Patient presents with  . Follow-up    ct results    HPI  Holly Mclaughlin is a 67 y.o. female was referred from [facility] after an incidental finding on a CT scan.  She was revealed to have aortic atherosclerosis.  Patient has a history of hyperlipidemia and denies any history of smoking.  She denies any claudication-like symptoms or rest pain.  She denies any wounds or ulcerations of her lower extremities.  She denies any chest pain or shortness of breath.  She denies any past coronary disease.  Past Medical History:  Diagnosis Date  . Collagen vascular disease (HCC)    RA  . GERD (gastroesophageal reflux disease)     Past Surgical History:  Procedure Laterality Date  . JOINT REPLACEMENT      Social History   Socioeconomic History  . Marital status: Married    Spouse name: Not on file  . Number of children: Not on file  . Years of education: Not on file  . Highest education level: Not on file  Occupational History  . Not on file  Social Needs  . Financial resource strain: Not on file  . Food insecurity:    Worry: Not on file    Inability: Not on file  . Transportation needs:    Medical: Not on file    Non-medical: Not on file  Tobacco Use  . Smoking status: Never Smoker  . Smokeless tobacco: Never Used  Substance and Sexual Activity  . Alcohol use: Yes    Comment: social  . Drug use: Never  . Sexual activity: Not on file  Lifestyle  . Physical activity:    Days per week: Not on file    Minutes per session: Not on file  . Stress: Not on file  Relationships  . Social connections:    Talks on phone: Not on file    Gets together: Not on file    Attends religious service: Not on file    Active member of club or organization: Not on file    Attends meetings of clubs or organizations: Not on file    Relationship status: Not on file  .  Intimate partner violence:    Fear of current or ex partner: Not on file    Emotionally abused: Not on file    Physically abused: Not on file    Forced sexual activity: Not on file  Other Topics Concern  . Not on file  Social History Narrative  . Not on file    Family History  Problem Relation Age of Onset  . Breast cancer Paternal Aunt 6  . Breast cancer Paternal Grandmother 65  . Breast cancer Cousin        2 pat cousins    Allergies  Allergen Reactions  . Lorazepam Itching     Review of Systems   Review of Systems: Negative Unless Checked Constitutional: [] Weight loss  [] Fever  [] Chills Cardiac: [] Chest pain   []  Atrial Fibrillation  [] Palpitations   [] Shortness of breath when laying flat   [] Shortness of breath with exertion. Vascular:  [] Pain in legs with walking   [] Pain in legs with standing  [] History of DVT   [] Phlebitis   [] Swelling in legs   [] Varicose veins   [] Non-healing ulcers Pulmonary:   [] Uses home oxygen   [] Productive cough   []   Hemoptysis   [] Wheeze  [] COPD   [] Asthma Neurologic:  [] Dizziness   [] Seizures   [] History of stroke   [] History of TIA  [] Aphasia   [] Vissual changes   [] Weakness or numbness in arm   [] Weakness or numbness in leg Musculoskeletal:   [] Joint swelling   [] Joint pain   [] Low back pain  []  History of Knee Replacement Hematologic:  [] Easy bruising  [] Easy bleeding   [] Hypercoagulable state   [] Anemic Gastrointestinal:  [] Diarrhea   [] Vomiting  [x] Gastroesophageal reflux/heartburn   [] Difficulty swallowing. Genitourinary:  [] Chronic kidney disease   [] Difficult urination  [] Anuric   [] Blood in urine Skin:  [] Rashes   [] Ulcers  Psychological:  [] History of anxiety   []  History of major depression  []  Memory Difficulties     Objective:   Physical Exam  BP (!) 155/78 (BP Location: Right Arm)   Pulse 78   Resp 16   Ht 4\' 11"  (1.499 m)   Wt 179 lb 12.8 oz (81.6 kg)   BMI 36.32 kg/m   Gen: WD/WN, NAD Head: [place]/AT, No temporalis  wasting.  Ear/Nose/Throat: Hearing grossly intact, nares w/o erythema or drainage Eyes: PER, EOMI, sclera nonicteric.  Neck: Supple, no masses.  No JVD.  Pulmonary:  Good air movement, no use of accessory muscles.  Cardiac: RRR Vascular:  Vessel Right Left  Radial Palpable Palpable  Dorsalis Pedis Palpable Palpable  Posterior Tibial Palpable Palpable   Gastrointestinal: soft, non-distended. No guarding/no peritoneal signs.  Musculoskeletal: M/S 5/5 throughout.  No deformity or atrophy.  Neurologic: Pain and light touch intact in extremities.  Symmetrical.  Speech is fluent. Motor exam as listed above. Psychiatric: Judgment intact, Mood & affect appropriate for pt's clinical situation. Dermatologic: No Venous rashes. No Ulcers Noted.  No changes consistent with cellulitis. Lymph : No Cervical lymphadenopathy, no lichenification or skin changes of chronic lymphedema.      Assessment & Plan:   1. Abdominal aortic atherosclerosis (HCC) We will obtain bilateral ABIs to rule out any other peripheral artery disease.  The patient and I had a long discussion about what the risk factors of peripheral vascular disease such as age over 6, family history, smoking, hypertension, hyperlipidemia, and diabetes.  Patient has very few risk factors for peripheral vascular disease, however we will obtain ABIs at her earliest.  Patient will follow-up in the office with me following her studies.  2. Arthritis Continue NSAID medications as already ordered, these medications have been reviewed and there are no changes at this time.  Continued activity and therapy was stressed.   3. Pure hypercholesterolemia Continue statin as ordered and reviewed, no changes at this time    Current Outpatient Medications on File Prior to Visit  Medication Sig Dispense Refill  . atorvastatin (LIPITOR) 20 MG tablet Take 20 mg by mouth daily.     Marland Kitchen buPROPion (WELLBUTRIN XL) 150 MG 24 hr tablet Take 150 mg by mouth  daily.  3  . acetaminophen (TYLENOL) 500 MG tablet Take 500 mg by mouth 2 (two) times daily.    . cimetidine (TAGAMET) 200 MG tablet Take 200 mg by mouth 2 (two) times daily as needed (heartburn).    . citalopram (CELEXA) 20 MG tablet Take 20 mg by mouth daily.    . cyclobenzaprine (FLEXERIL) 5 MG tablet Take 5 mg by mouth 3 (three) times daily as needed for muscle spasms.    Marland Kitchen docusate sodium (COLACE) 100 MG capsule Take 100 mg by mouth daily as needed for mild  constipation.     No current facility-administered medications on file prior to visit.     There are no Patient Instructions on file for this visit. No follow-ups on file.   Kris Hartmann, NP  This note was completed with Sales executive.  Any errors are purely unintentional.

## 2018-08-02 ENCOUNTER — Encounter (INDEPENDENT_AMBULATORY_CARE_PROVIDER_SITE_OTHER): Payer: Self-pay | Admitting: Nurse Practitioner

## 2018-08-02 NOTE — Progress Notes (Signed)
Subjective:    Patient ID: Holly Mclaughlin, female    DOB: July 08, 1951, 67 y.o.   MRN: [id number] Chief Complaint  Patient presents with  . Follow-up    ultrasound follow up    HPI  Holly Mclaughlin is a 67 y.o. female that is following up after a recent CT scan showed some aortic atherosclerosis.  The patient denies any claudication-like symptoms or rest pain.  She denies any chest pain or shortness of breath.  She does not have a history of tobacco usage.  She has a strong family history of coronary disease.  She has additional atherosclerosis risk factors such as hyperlipidemia.  Patient denies any fever, chills, nausea, vomiting or diarrhea.  The patient recently underwent bilateral ABIs with an ABI of 1.16 on the right and ABI 1.05 on the left.  There are no previous comparison studies.  She had triphasic waveforms bilaterally.  Past Medical History:  Diagnosis Date  . Collagen vascular disease (HCC)    RA  . GERD (gastroesophageal reflux disease)     Past Surgical History:  Procedure Laterality Date  . JOINT REPLACEMENT      Social History   Socioeconomic History  . Marital status: Married    Spouse name: Not on file  . Number of children: Not on file  . Years of education: Not on file  . Highest education level: Not on file  Occupational History  . Not on file  Social Needs  . Financial resource strain: Not on file  . Food insecurity:    Worry: Not on file    Inability: Not on file  . Transportation needs:    Medical: Not on file    Non-medical: Not on file  Tobacco Use  . Smoking status: Never Smoker  . Smokeless tobacco: Never Used  Substance and Sexual Activity  . Alcohol use: Yes    Comment: social  . Drug use: Never  . Sexual activity: Not on file  Lifestyle  . Physical activity:    Days per week: Not on file    Minutes per session: Not on file  . Stress: Not on file  Relationships  . Social connections:    Talks on phone: Not on file   Gets together: Not on file    Attends religious service: Not on file    Active member of club or organization: Not on file    Attends meetings of clubs or organizations: Not on file    Relationship status: Not on file  . Intimate partner violence:    Fear of current or ex partner: Not on file    Emotionally abused: Not on file    Physically abused: Not on file    Forced sexual activity: Not on file  Other Topics Concern  . Not on file  Social History Narrative  . Not on file    Family History  Problem Relation Age of Onset  . Breast cancer Paternal Aunt 36  . Breast cancer Paternal Grandmother 55  . Breast cancer Cousin        2 pat cousins    Allergies  Allergen Reactions  . Lorazepam Itching     Review of Systems   Review of Systems: Negative Unless Checked Constitutional: [] Weight loss  [] Fever  [] Chills Cardiac: [] Chest pain   []  Atrial Fibrillation  [] Palpitations   [] Shortness of breath when laying flat   [] Shortness of breath with exertion. Vascular:  [] Pain in legs with walking   []   Pain in legs with standing  [] History of DVT   [] Phlebitis   [] Swelling in legs   [] Varicose veins   [] Non-healing ulcers Pulmonary:   [] Uses home oxygen   [] Productive cough   [] Hemoptysis   [] Wheeze  [] COPD   [] Asthma Neurologic:  [] Dizziness   [] Seizures   [] History of stroke   [] History of TIA  [] Aphasia   [] Vissual changes   [] Weakness or numbness in arm   [] Weakness or numbness in leg Musculoskeletal:   [] Joint swelling   [x] Joint pain   [] Low back pain  []  History of Knee Replacement Hematologic:  [] Easy bruising  [] Easy bleeding   [] Hypercoagulable state   [] Anemic Gastrointestinal:  [] Diarrhea   [] Vomiting  [x] Gastroesophageal reflux/heartburn   [] Difficulty swallowing. Genitourinary:  [] Chronic kidney disease   [] Difficult urination  [] Anuric   [] Blood in urine Skin:  [] Rashes   [] Ulcers  Psychological:  [x] History of anxiety   []  History of major depression  []  Memory  Difficulties     Objective:   Physical Exam  BP (!) 155/83 (BP Location: Right Arm)   Pulse 76   Resp 16   Ht 4\' 11"  (1.499 m)   Wt 179 lb 9.6 oz (81.5 kg)   BMI 36.27 kg/m   Gen: WD/WN, NAD Head: [place]/AT, No temporalis wasting.  Ear/Nose/Throat: Hearing grossly intact, nares w/o erythema or drainage Eyes: PER, EOMI, sclera nonicteric.  Neck: Supple, no masses.  No JVD.  Pulmonary:  Good air movement, no use of accessory muscles.  Cardiac: RRR Vascular:  Vessel Right Left  Radial Palpable Palpable  Dorsalis Pedis Palpable Palpable  Posterior Tibial Palpable Palpable   Gastrointestinal: soft, non-distended. No guarding/no peritoneal signs.  Musculoskeletal: M/S 5/5 throughout.  No deformity or atrophy.  Neurologic: Pain and light touch intact in extremities.  Symmetrical.  Speech is fluent. Motor exam as listed above. Psychiatric: Judgment intact, Mood & affect appropriate for pt's clinical situation. Dermatologic: No Venous rashes. No Ulcers Noted.  No changes consistent with cellulitis. Lymph : No Cervical lymphadenopathy, no lichenification or skin changes of chronic lymphedema.      Assessment & Plan:   1. Abdominal aortic atherosclerosis (HCC) Recommend:  I do not find evidence of life style limiting vascular disease. The patient specifically denies life style limitation.  Previous noninvasive studies including ABI's of the legs do not identify critical vascular problems.  The patient should continue walking and begin a more formal exercise program. The patient should continue his antiplatelet therapy and aggressive treatment of the lipid abnormalities.  The patient should begin wearing graduated compression socks 15-20 mmHg strength to control her mild edema.  Patient will follow-up in 2 years   2. Pure hypercholesterolemia Continue statin as ordered and reviewed, no changes at this time   3. Arthritis Continue NSAID medications as already ordered, these  medications have been reviewed and there are no changes at this time.  Continued activity and therapy was stressed.    Current Outpatient Medications on File Prior to Visit  Medication Sig Dispense Refill  . acetaminophen (TYLENOL) 500 MG tablet Take 500 mg by mouth 2 (two) times daily.    Marland Kitchen atorvastatin (LIPITOR) 20 MG tablet Take 20 mg by mouth daily.     Marland Kitchen buPROPion (WELLBUTRIN XL) 150 MG 24 hr tablet Take 150 mg by mouth daily.  3  . cimetidine (TAGAMET) 200 MG tablet Take 200 mg by mouth 2 (two) times daily as needed (heartburn).    . citalopram (CELEXA) 20 MG  tablet Take 20 mg by mouth daily.    . cyclobenzaprine (FLEXERIL) 5 MG tablet Take 5 mg by mouth 3 (three) times daily as needed for muscle spasms.    Marland Kitchen docusate sodium (COLACE) 100 MG capsule Take 100 mg by mouth daily as needed for mild constipation.     No current facility-administered medications on file prior to visit.     There are no Patient Instructions on file for this visit. Return in about 2 years (around 07/31/2020).   Kris Hartmann, NP  This note was completed with Sales executive.  Any errors are purely unintentional.

## 2018-08-03 ENCOUNTER — Encounter
Admission: RE | Admit: 2018-08-03 | Discharge: 2018-08-03 | Disposition: A | Discharge status: Home / Self Care | Payer: Medicare Other | Source: Ambulatory Visit | Attending: Obstetrics and Gynecology | Admitting: Obstetrics and Gynecology

## 2018-08-03 ENCOUNTER — Other Ambulatory Visit: Payer: Self-pay

## 2018-08-03 DIAGNOSIS — Z01812 Encounter for preprocedural laboratory examination: Secondary | ICD-10-CM | POA: Insufficient documentation

## 2018-08-03 HISTORY — DX: Depression, unspecified: F32.A

## 2018-08-03 HISTORY — DX: Major depressive disorder, single episode, unspecified: F32.9

## 2018-08-03 HISTORY — DX: Hyperlipidemia, unspecified: E78.5

## 2018-08-03 LAB — TYPE AND SCREEN
ABO/RH(D): O POS
ANTIBODY SCREEN: NEGATIVE

## 2018-08-03 LAB — CBC
HCT: 44.3 % (ref 36.0–46.0)
Hemoglobin: 14.5 g/dL (ref 12.0–15.0)
MCH: 30.1 pg (ref 26.0–34.0)
MCHC: 32.7 g/dL (ref 30.0–36.0)
MCV: 92.1 fL (ref 80.0–100.0)
PLATELETS: 233 10*3/uL (ref 150–400)
RBC: 4.81 MIL/uL (ref 3.87–5.11)
RDW: 12.2 % (ref 11.5–15.5)
WBC: 5 10*3/uL (ref 4.0–10.5)
nRBC: 0 % (ref 0.0–0.2)

## 2018-08-03 LAB — BASIC METABOLIC PANEL
Anion gap: 10 (ref 5–15)
BUN: 15 mg/dL (ref 8–23)
CALCIUM: 9.1 mg/dL (ref 8.9–10.3)
CHLORIDE: 104 mmol/L (ref 98–111)
CO2: 25 mmol/L (ref 22–32)
CREATININE: 0.72 mg/dL (ref 0.44–1.00)
GFR calc Af Amer: >60 mL/min (ref >60)
Glucose, Bld: 86 mg/dL (ref 70–99)
Potassium: 4.1 mmol/L (ref 3.5–5.1)
SODIUM: 139 mmol/L (ref 135–145)

## 2018-08-03 NOTE — Patient Instructions (Addendum)
Your procedure is scheduled on: Friday, August 11, 2018 Report to Day Surgery on the 2nd floor of the [facility]. To find out your arrival time, please call [phone] between 1PM - 3PM on: Thursday, August 10, 2018  REMEMBER: Instructions that are not followed completely may result in serious medical risk, up to and including death; or upon the discretion of your surgeon and anesthesiologist your surgery may need to be rescheduled.  Do not eat food after midnight the night before surgery.  No gum chewing, lozengers or hard candies.  You may however, drink CLEAR liquids up to 2 hours before you are scheduled to arrive for your surgery. Do not drink anything within 2 hours of the start of your surgery.  Clear liquids include: - water  - apple juice without pulp - gatorade - black coffee or tea (Do NOT add milk or creamers to the coffee or tea) Do NOT drink anything that is not on this list.  No Alcohol for 24 hours before or after surgery.  No Smoking including e-cigarettes for 24 hours prior to surgery.  No chewable tobacco products for at least 6 hours prior to surgery.  No nicotine patches on the day of surgery.  On the morning of surgery brush your teeth with toothpaste and water, you may rinse your mouth with mouthwash if you wish. Do not swallow any toothpaste or mouthwash.  Notify your doctor if there is any change in your medical condition (cold, fever, infection).  Do not wear jewelry, make-up, hairpins, clips or nail polish.  Do not wear lotions, powders, or perfumes.   Do not shave 48 hours prior to surgery.   Contacts and dentures may not be worn into surgery.  Do not bring valuables to the hospital, including drivers license, insurance or credit cards.  [facility] is not responsible for any belongings or valuables.   TAKE THESE MEDICATIONS THE MORNING OF SURGERY:  1.  Bupropion 2.  Cimetidine  (take one the night before and one on the morning of  surgery - helps to prevent nausea after surgery.) 3.  citalopram  NOW!  Stop Anti-inflammatories (NSAIDS) such as Advil, Aleve, Ibuprofen, Motrin, Naproxen, Naprosyn and Aspirin based products such as Excedrin, Goodys Powder, BC Powder. (May take Tylenol or Acetaminophen if needed.)  NOW!  Stop ANY OVER THE COUNTER supplements until after surgery.  Wear comfortable clothing (specific to your surgery type) to the hospital.  If you are being discharged the day of surgery, you will not be allowed to drive home. You will need a responsible adult to drive you home and stay with you that night.   If you are taking public transportation, you will need to have a responsible adult with you. Please confirm with your physician that it is acceptable to use public transportation.   Please call [phone] if you have any questions about these instructions.

## 2018-08-11 ENCOUNTER — Other Ambulatory Visit: Payer: Self-pay

## 2018-08-11 ENCOUNTER — Ambulatory Visit
Admission: RE | Admit: 2018-08-11 | Discharge: 2018-08-11 | Disposition: A | Discharge status: Home / Self Care | Payer: Medicare Other | Source: Ambulatory Visit | Attending: Obstetrics and Gynecology | Admitting: Obstetrics and Gynecology

## 2018-08-11 ENCOUNTER — Ambulatory Visit: Payer: Medicare Other | Admitting: Certified Registered Nurse Anesthetist

## 2018-08-11 ENCOUNTER — Encounter
Admission: RE | Disposition: A | Discharge status: Home / Self Care | Payer: Self-pay | Source: Ambulatory Visit | Attending: Obstetrics and Gynecology

## 2018-08-11 DIAGNOSIS — F329 Major depressive disorder, single episode, unspecified: Secondary | ICD-10-CM | POA: Insufficient documentation

## 2018-08-11 DIAGNOSIS — Z8249 Family history of ischemic heart disease and other diseases of the circulatory system: Secondary | ICD-10-CM | POA: Diagnosis not present

## 2018-08-11 DIAGNOSIS — K581 Irritable bowel syndrome with constipation: Secondary | ICD-10-CM | POA: Insufficient documentation

## 2018-08-11 DIAGNOSIS — M1990 Unspecified osteoarthritis, unspecified site: Secondary | ICD-10-CM | POA: Insufficient documentation

## 2018-08-11 DIAGNOSIS — Z79899 Other long term (current) drug therapy: Secondary | ICD-10-CM | POA: Insufficient documentation

## 2018-08-11 DIAGNOSIS — K219 Gastro-esophageal reflux disease without esophagitis: Secondary | ICD-10-CM | POA: Diagnosis not present

## 2018-08-11 DIAGNOSIS — R9389 Abnormal findings on diagnostic imaging of other specified body structures: Secondary | ICD-10-CM | POA: Diagnosis present

## 2018-08-11 DIAGNOSIS — N840 Polyp of corpus uteri: Secondary | ICD-10-CM | POA: Diagnosis not present

## 2018-08-11 DIAGNOSIS — E785 Hyperlipidemia, unspecified: Secondary | ICD-10-CM | POA: Diagnosis not present

## 2018-08-11 DIAGNOSIS — N950 Postmenopausal bleeding: Secondary | ICD-10-CM | POA: Insufficient documentation

## 2018-08-11 DIAGNOSIS — R310 Gross hematuria: Secondary | ICD-10-CM | POA: Insufficient documentation

## 2018-08-11 DIAGNOSIS — Z96652 Presence of left artificial knee joint: Secondary | ICD-10-CM | POA: Diagnosis not present

## 2018-08-11 DIAGNOSIS — Z888 Allergy status to other drugs, medicaments and biological substances status: Secondary | ICD-10-CM | POA: Insufficient documentation

## 2018-08-11 DIAGNOSIS — H9190 Unspecified hearing loss, unspecified ear: Secondary | ICD-10-CM | POA: Diagnosis not present

## 2018-08-11 DIAGNOSIS — Z833 Family history of diabetes mellitus: Secondary | ICD-10-CM | POA: Diagnosis not present

## 2018-08-11 DIAGNOSIS — Z8041 Family history of malignant neoplasm of ovary: Secondary | ICD-10-CM | POA: Insufficient documentation

## 2018-08-11 HISTORY — PX: DILATATION & CURETTAGE/HYSTEROSCOPY WITH MYOSURE: SHX6511

## 2018-08-11 LAB — ABO/RH: ABO/RH(D): O POS

## 2018-08-11 SURGERY — DILATATION & CURETTAGE/HYSTEROSCOPY WITH MYOSURE
Anesthesia: General

## 2018-08-11 MED ORDER — ACETAMINOPHEN 10 MG/ML IV SOLN
INTRAVENOUS | Status: DC | PRN
  Administered 2018-08-11: 1000 mg via INTRAVENOUS

## 2018-08-11 MED ORDER — LACTATED RINGERS IV SOLN
INTRAVENOUS | Status: DC
  Administered 2018-08-11: 12:00:00 via INTRAVENOUS

## 2018-08-11 MED ORDER — CEFAZOLIN SODIUM-DEXTROSE 2-4 GM/100ML-% IV SOLN
2.0000 g | Freq: Once | INTRAVENOUS | Status: AC
  Administered 2018-08-11: 2 g via INTRAVENOUS

## 2018-08-11 MED ORDER — FENTANYL CITRATE (PF) 100 MCG/2ML IJ SOLN
INTRAMUSCULAR | Status: DC | PRN
  Administered 2018-08-11 (×2): 50 ug via INTRAVENOUS

## 2018-08-11 MED ORDER — MIDAZOLAM HCL 2 MG/2ML IJ SOLN
INTRAMUSCULAR | Status: DC | PRN
  Administered 2018-08-11 (×2): 1 mg via INTRAVENOUS

## 2018-08-11 MED ORDER — KETOROLAC TROMETHAMINE 30 MG/ML IJ SOLN
INTRAMUSCULAR | Status: DC | PRN
  Administered 2018-08-11: 30 mg via INTRAVENOUS

## 2018-08-11 MED ORDER — DEXAMETHASONE SODIUM PHOSPHATE 10 MG/ML IJ SOLN
INTRAMUSCULAR | Status: DC | PRN
  Administered 2018-08-11: 5 mg via INTRAVENOUS

## 2018-08-11 MED ORDER — ROCURONIUM BROMIDE 100 MG/10ML IV SOLN
INTRAVENOUS | Status: DC | PRN
  Administered 2018-08-11: 10 mg via INTRAVENOUS

## 2018-08-11 MED ORDER — LIDOCAINE HCL (PF) 2 % IJ SOLN
INTRAMUSCULAR | Status: AC
  Filled 2018-08-11: qty 10

## 2018-08-11 MED ORDER — PROMETHAZINE HCL 25 MG/ML IJ SOLN
INTRAMUSCULAR | Status: AC
  Administered 2018-08-11: 6.25 mg via INTRAVENOUS
  Filled 2018-08-11: qty 1

## 2018-08-11 MED ORDER — HYDROMORPHONE HCL 1 MG/ML IJ SOLN
INTRAMUSCULAR | Status: AC
  Filled 2018-08-11: qty 1

## 2018-08-11 MED ORDER — SUCCINYLCHOLINE CHLORIDE 20 MG/ML IJ SOLN
INTRAMUSCULAR | Status: DC | PRN
  Administered 2018-08-11: 100 mg via INTRAVENOUS

## 2018-08-11 MED ORDER — MIDAZOLAM HCL 2 MG/2ML IJ SOLN
INTRAMUSCULAR | Status: AC
  Filled 2018-08-11: qty 2

## 2018-08-11 MED ORDER — ONDANSETRON HCL 4 MG/2ML IJ SOLN
INTRAMUSCULAR | Status: AC
  Filled 2018-08-11: qty 2

## 2018-08-11 MED ORDER — ONDANSETRON HCL 4 MG/2ML IJ SOLN
INTRAMUSCULAR | Status: DC | PRN
  Administered 2018-08-11: 4 mg via INTRAVENOUS

## 2018-08-11 MED ORDER — HYDROMORPHONE HCL 1 MG/ML IJ SOLN: 0.2500 mg | INTRAMUSCULAR | Status: DC | PRN

## 2018-08-11 MED ORDER — CEFAZOLIN SODIUM-DEXTROSE 2-4 GM/100ML-% IV SOLN
INTRAVENOUS | Status: AC
  Filled 2018-08-11: qty 100

## 2018-08-11 MED ORDER — FENTANYL CITRATE (PF) 100 MCG/2ML IJ SOLN
INTRAMUSCULAR | Status: AC
  Filled 2018-08-11: qty 2

## 2018-08-11 MED ORDER — DEXAMETHASONE SODIUM PHOSPHATE 10 MG/ML IJ SOLN
INTRAMUSCULAR | Status: AC
  Filled 2018-08-11: qty 1

## 2018-08-11 MED ORDER — ACETAMINOPHEN 10 MG/ML IV SOLN
INTRAVENOUS | Status: AC
  Filled 2018-08-11: qty 100

## 2018-08-11 MED ORDER — PROPOFOL 10 MG/ML IV BOLUS
INTRAVENOUS | Status: DC | PRN
  Administered 2018-08-11: 180 mg via INTRAVENOUS

## 2018-08-11 MED ORDER — LIDOCAINE HCL (CARDIAC) PF 100 MG/5ML IV SOSY
PREFILLED_SYRINGE | INTRAVENOUS | Status: DC | PRN
  Administered 2018-08-11: 100 mg via INTRAVENOUS

## 2018-08-11 MED ORDER — PROPOFOL 10 MG/ML IV BOLUS
INTRAVENOUS | Status: AC
  Filled 2018-08-11: qty 20

## 2018-08-11 MED ORDER — SODIUM CHLORIDE FLUSH 0.9 % IV SOLN
INTRAVENOUS | Status: AC
  Filled 2018-08-11: qty 10

## 2018-08-11 MED ORDER — PROMETHAZINE HCL 25 MG/ML IJ SOLN
6.2500 mg | INTRAMUSCULAR | Status: DC | PRN
  Administered 2018-08-11: 6.25 mg via INTRAVENOUS

## 2018-08-11 SURGICAL SUPPLY — 23 items
CANISTER SUCT 3000ML PPV (MISCELLANEOUS) ×2 IMPLANT
CATH ROBINSON RED A/P 16FR (CATHETERS) ×2 IMPLANT
COVER WAND RF STERILE (DRAPES) ×2 IMPLANT
DEVICE MYOSURE LITE (MISCELLANEOUS) IMPLANT
DEVICE MYOSURE REACH (MISCELLANEOUS) IMPLANT
GLOVE BIO SURGEON STRL SZ8 (GLOVE) ×2 IMPLANT
GOWN STRL REUS W/ TWL LRG LVL3 (GOWN DISPOSABLE) ×1 IMPLANT
GOWN STRL REUS W/ TWL XL LVL3 (GOWN DISPOSABLE) ×1 IMPLANT
GOWN STRL REUS W/TWL LRG LVL3 (GOWN DISPOSABLE) ×1
GOWN STRL REUS W/TWL XL LVL3 (GOWN DISPOSABLE) ×1
KIT PROCEDURE FLUENT (KITS) ×2 IMPLANT
KIT TURNOVER CYSTO (KITS) ×2 IMPLANT
MYOSURE LITE POLYP REMOVAL (MISCELLANEOUS) ×2 IMPLANT
NDL SAFETY ECLIPSE 18X1.5 (NEEDLE) ×1 IMPLANT
NEEDLE HYPO 18GX1.5 SHARP (NEEDLE) ×1
PACK DNC HYST (MISCELLANEOUS) ×2 IMPLANT
PAD OB MATERNITY 4.3X12.25 (PERSONAL CARE ITEMS) ×2 IMPLANT
PAD PREP 24X41 OB/GYN DISP (PERSONAL CARE ITEMS) ×2 IMPLANT
SOL .9 NS 3000ML IRR  AL (IV SOLUTION) ×1
SOL .9 NS 3000ML IRR UROMATIC (IV SOLUTION) ×1 IMPLANT
SYR 20CC LL (SYRINGE) ×2 IMPLANT
TOWEL OR 17X26 4PK STRL BLUE (TOWEL DISPOSABLE) ×2 IMPLANT
TUBING CONNECTING 10 (TUBING) ×2 IMPLANT

## 2018-08-11 NOTE — Discharge Instructions (Signed)

## 2018-08-11 NOTE — Anesthesia Post-op Follow-up Note (Signed)
Anesthesia QCDR form completed.        

## 2018-08-11 NOTE — Anesthesia Preprocedure Evaluation (Addendum)
Anesthesia Evaluation  Patient identified by MRN, date of birth, ID band Patient awake    Reviewed: Allergy & Precautions, H&P , NPO status , Patient's Chart, lab work & pertinent test results  Airway Mallampati: III  TM Distance: >3 FB     Dental  (+) Teeth Intact   Pulmonary neg pulmonary ROS,           Cardiovascular negative cardio ROS       Neuro/Psych PSYCHIATRIC DISORDERS Depression negative neurological ROS  negative psych ROS   GI/Hepatic negative GI ROS, Neg liver ROS, GERD  ,  Endo/Other  negative endocrine ROS  Renal/GU      Musculoskeletal  (+) Arthritis ,   Abdominal   Peds  Hematology negative hematology ROS (+)   Anesthesia Other Findings Past Medical History: No date: Collagen vascular disease (HCC)     Comment:  RA No date: Depression No date: GERD (gastroesophageal reflux disease) No date: Hyperlipidemia  Past Surgical History: 2014: JOINT REPLACEMENT; Left     Comment:  knee 2014: REPLACEMENT TOTAL KNEE; Left No date: TONSILLECTOMY No date: TUBAL LIGATION     Reproductive/Obstetrics negative OB ROS                           Anesthesia Physical Anesthesia Plan  ASA: II  Anesthesia Plan: General ETT   Post-op Pain Management:    Induction:   PONV Risk Score and Plan: Dexamethasone, Ondansetron and Midazolam  Airway Management Planned:   Additional Equipment:   Intra-op Plan:   Post-operative Plan:   Informed Consent: I have reviewed the patients History and Physical, chart, labs and discussed the procedure including the risks, benefits and alternatives for the proposed anesthesia with the patient or authorized representative who has indicated his/her understanding and acceptance.   Dental Advisory Given  Plan Discussed with: Anesthesiologist, CRNA and Surgeon  Anesthesia Plan Comments:        Anesthesia Quick Evaluation

## 2018-08-11 NOTE — Brief Op Note (Signed)
08/11/2018  4:48 PM  PATIENT:  Holly Mclaughlin  67 y.o. female  PRE-OPERATIVE DIAGNOSIS:  postmenopausal bleeding, endometrial polyp  POST-OPERATIVE DIAGNOSIS:  postmenopausal bleeding, endometrial polyp  PROCEDURE:  Procedure(s): DILATATION & CURETTAGE/HYSTEROSCOPY WITH MYOSURE (N/A)  SURGEON:  Surgeon(s) and Role:    * Schermerhorn, Gwen Her, MD - Primary  PHYSICIAN ASSISTANT: none  ASSISTANTS: none   ANESTHESIA:   general  EBL:  5 mL   BLOOD ADMINISTERED:none  DRAINS: none   LOCAL MEDICATIONS USED:  NONE  SPECIMEN:  Source of Specimen:  ecc , endometrial polyps with endometrial curettings   DISPOSITION OF SPECIMEN:  PATHOLOGY  COUNTS:  YES  TOURNIQUET:  * No tourniquets in log *  DICTATION: .Other Dictation: Dictation Number verbal  PLAN OF CARE: Discharge to home after PACU  PATIENT DISPOSITION:  PACU - hemodynamically stable.   Delay start of Pharmacological VTE agent (>24hrs) due to surgical blood loss or risk of bleeding: not applicable

## 2018-08-11 NOTE — Anesthesia Postprocedure Evaluation (Signed)
Anesthesia Post Note  Patient: Holly Mclaughlin  Procedure(s) Performed: DILATATION & CURETTAGE/HYSTEROSCOPY WITH MYOSURE (N/A )  Patient location during evaluation: PACU Anesthesia Type: General Level of consciousness: awake and alert Pain management: pain level controlled Vital Signs Assessment: post-procedure vital signs reviewed and stable Respiratory status: spontaneous breathing, nonlabored ventilation, respiratory function stable and patient connected to nasal cannula oxygen Cardiovascular status: blood pressure returned to baseline and stable Postop Assessment: no apparent nausea or vomiting Anesthetic complications: no     Last Vitals:  Vitals:   08/11/18 1147 08/11/18 1700  BP: (!) 128/45 (!) 103/52  Pulse: (!) 43 82  Resp: 12 14  Temp: 36.6 C 36.6 C  SpO2: 96% 97%    Last Pain:  Vitals:   08/11/18 1700  TempSrc:   PainSc: Asleep                 Precious Haws Piscitello

## 2018-08-11 NOTE — Anesthesia Procedure Notes (Signed)
Procedure Name: Intubation Date/Time: 08/11/2018 4:07 PM Performed by: Johnna Acosta, CRNA Pre-anesthesia Checklist: Patient identified, Emergency Drugs available, Suction available, Patient being monitored and Timeout performed Patient Re-evaluated:Patient Re-evaluated prior to induction Oxygen Delivery Method: Circle system utilized Preoxygenation: Pre-oxygenation with 100% oxygen Induction Type: IV induction Ventilation: Mask ventilation without difficulty and Oral airway inserted - appropriate to patient size Laryngoscope Size: Sabra Heck and 2 Grade View: Grade I Tube type: Oral Tube size: 7.0 mm Number of attempts: 1 Airway Equipment and Method: Stylet Placement Confirmation: ETT inserted through vocal cords under direct vision,  positive ETCO2 and breath sounds checked- equal and bilateral Secured at: 22 cm Tube secured with: Tape Dental Injury: Teeth and Oropharynx as per pre-operative assessment

## 2018-08-11 NOTE — Progress Notes (Signed)
Pt is ready for Fractional D+C and myosure resection of polyos . NPO . All questions answered .

## 2018-08-11 NOTE — Transfer of Care (Signed)
Immediate Anesthesia Transfer of Care Note  Patient: Holly Mclaughlin  Procedure(s) Performed: DILATATION & CURETTAGE/HYSTEROSCOPY WITH MYOSURE (N/A )  Patient Location: PACU  Anesthesia Type:General  Level of Consciousness: drowsy  Airway & Oxygen Therapy: Patient Spontanous Breathing and Patient connected to face mask oxygen  Post-op Assessment: Report given to RN and Post -op Vital signs reviewed and stable  Post vital signs: Reviewed and stable  Last Vitals:  Vitals Value Taken Time  BP 103/52 08/11/2018  5:00 PM  Temp 36.6 C 08/11/2018  5:00 PM  Pulse 83 08/11/2018  5:00 PM  Resp 14 08/11/2018  5:00 PM  SpO2 96 % 08/11/2018  5:00 PM  Vitals shown include unvalidated device data.  Last Pain:  Vitals:   08/11/18 1147  TempSrc: Oral  PainSc: 0-No pain         Complications: No apparent anesthesia complications

## 2018-08-12 ENCOUNTER — Encounter: Payer: Self-pay | Admitting: Obstetrics and Gynecology

## 2018-08-12 NOTE — Op Note (Signed)
NAME: Holly Mclaughlin, Holly Mclaughlin MEDICAL RECORD [id number] ACCOUNT [id number] DATE OF BIRTH:[birth year] FACILITY: [facility] LOCATION: [facility] PHYSICIAN:Ibrahim Mcpheeters Josefine Class, MD  OPERATIVE REPORT  DATE OF PROCEDURE:  08/11/2018  PREOPERATIVE DIAGNOSES: 1.  Thickened endometrial stripe. 2.  Endometrial polyps.  POSTOPERATIVE DIAGNOSES: 1.  Thickened endometrial stripe. 2.  Endometrial polyps.  PROCEDURE: 1.  Fractional dilation and curettage. 2.  Hysteroscopy with MyoSure resection of endometrial polyps.  SURGEON:  Laverta Baltimore, MD  ANESTHESIA:  General endotracheal anesthesia.  INDICATIONS:  A 67 year old female with initial workup for hematuria was noted to have a thickened endometrial stripe, and ultrasound demonstrated endometrial polyps.  DESCRIPTION OF PROCEDURE:  After adequate general endotracheal anesthesia, the patient was placed in dorsal supine position with the legs in the candy cane stirrups.  The patient did receive 2 g IV Ancef prior to commencement of the case.  Timeout was  performed.  Straight catheterization of the bladder yielded 50 mL of urine.  The patient did have some urinary incontinence prior to commencement of the case, approximately 100 mL.  A weighted speculum was placed in the posterior vaginal vault, and the  anterior cervix was grasped with a single-tooth tenaculum.  Significant cervical stenosis was noted with a pinpoint opening to the endocervix.  A #18-gauge needle was used to make a nick in the membrane covering this area which allowed for a general  mucoid ooze to come from this hole.  Lacrimal probe dilators were then used followed by Hanks dilators to dilate the cervix to #15 Hank dilator.  An endocervical curettage was performed.  Uterus was sounded to 8 cm.  The hysteroscope was then advanced  into the endometrial cavity that documented several endometrial polyps.  The MyoSure resector was brought onto the operative field, and the polyps  were resected without difficulty.  Good hemostasis was noted.  The hysteroscope was removed, and a gentle  endometrial curettage was then performed.  This tissue will be sent along with the endometrial polyps for pathologic review.  The single-tooth tenaculum was removed.  Good hemostasis was noted.  COMPLICATIONS:  None.  ESTIMATED BLOOD LOSS:  5 mL.  INTRAOPERATIVE FLUIDS:  700 mL.  URINE OUTPUT:  150 mL.  Deficit from the MyoSure procedure -50 mL.  The patient was taken to recovery room in good condition.  LN/NUANCE  D:08/11/2018 T:08/12/2018 JOB:[id number]

## 2018-08-16 LAB — SURGICAL PATHOLOGY

## 2018-09-18 NOTE — H&P (Signed)
[name] is a 67 y.o. female here for Post Operative Visit . PAthology shaows EIN , atypical    G0  Past Medical History:  has a past medical history of Arthritis, Depression, Hearing loss, central, Hyperlipidemia, and Irritable bowel syndrome with constipation (06/28/2014).  Past Surgical History:  has a past surgical history that includes Tonsillectomy; Tubal ligation; Replacement total knee (Left, 2014); Colonoscopy (06/29/2010); egd (04/14/2011); Colonoscopy (09/09/2015); and Fx D&C, hysteroscopy with Myosure (08/11/2018). Family History: family history includes Angina in her father; Coronary Artery Disease (Blocked arteries around heart) in her father, mother, and paternal grandmother; Diabetes type II in her mother; Heart disease in her mother and paternal grandmother; Ovarian cancer in her cousin; Stroke in her father. Social History:  reports that she has never smoked. She has never used smokeless tobacco. She reports current alcohol use. She reports that she does not use drugs. OB/GYN History:  OB History   No obstetric history on file.     Allergies: is allergic to ativan [lorazepam]. Medications:  Current Outpatient Medications:  .  acetaminophen (TYLENOL) 650 MG ER tablet, Take 650 mg by mouth every 8 (eight) hours as needed.  , Disp: , Rfl:  .  atorvastatin (LIPITOR) 20 MG tablet, Take 1 tablet (20 mg total) by mouth once daily, Disp: 90 tablet, Rfl: 3 .  citalopram (CELEXA) 20 MG tablet, Take 1 tablet (20 mg total) by mouth once daily, Disp: 90 tablet, Rfl: 3 .  cyclobenzaprine (FLEXERIL) 5 MG tablet, 1 po bid prn for muscle spasms. Not for daily use., Disp: 30 tablet, Rfl: 2 .  docusate (COLACE) 100 MG capsule, , Disp: , Rfl:  .  FEXOFENADINE/PSEUDOEPHEDRINE (ALLERGY RELIEF D ORAL), Take by mouth., Disp: , Rfl:  .  ranitidine (ZANTAC) 150 MG capsule, Take 150 mg by mouth as needed for Heartburn., Disp: , Rfl:  .  SIMETHICONE (PHAZYME ORAL), Take 180 mg by mouth as  needed., Disp: , Rfl:  .  buPROPion (WELLBUTRIN XL) 150 MG XL tablet, Take 1 tablet (150 mg total) by mouth once daily, Disp: 90 tablet, Rfl: 3 .  dextromethorphan polistirex (DELSYM) 30 mg/5 mL liquid, Take by mouth 2 (two) times daily., Disp: , Rfl:   Review of Systems: General:                      No fatigue or weight loss Eyes:                           No vision changes Ears:                            No hearing difficulty Respiratory:                No cough or shortness of breath Pulmonary:                  No asthma or shortness of breath Cardiovascular:           No chest pain, palpitations, dyspnea on exertion Gastrointestinal:          No abdominal bloating, chronic diarrhea, constipations, masses, pain or hematochezia Genitourinary:             No hematuria, dysuria, abnormal vaginal discharge, pelvic pain, Menometrorrhagia Lymphatic:                   No swollen lymph  nodes Musculoskeletal:         No muscle weakness Neurologic:                  No extremity weakness, syncope, seizure disorder Psychiatric:                  No history of depression, delusions or suicidal/homicidal ideation    Exam:      Vitals:   08/30/18 1012  BP: 140/78  Pulse: 87    Body mass index is 38.25 kg/m.  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 She is at risk for endometrial cancer    Plan:   TVH , possible BSO if accessible       Return if symptoms worsen or fail to improve, for preop.  Holly Sauger, MD       Electronically signed by Holly Sauger, MD on 09/18/18 10:44 AM      Post Op on 08/30/2018      Note shared with patient  Department   Name Address Phone [facility] [place] [address] [phone]  Service Location   Name Address    [place] [address]

## 2018-09-19 ENCOUNTER — Other Ambulatory Visit: Payer: Self-pay

## 2018-09-19 ENCOUNTER — Encounter
Admission: RE | Admit: 2018-09-19 | Discharge: 2018-09-19 | Disposition: A | Discharge status: Home / Self Care | Payer: Medicare Other | Source: Ambulatory Visit | Attending: Obstetrics and Gynecology | Admitting: Obstetrics and Gynecology

## 2018-09-19 ENCOUNTER — Inpatient Hospital Stay: Admission: RE | Admit: 2018-09-19 | Payer: Medicare Other | Source: Ambulatory Visit

## 2018-09-19 DIAGNOSIS — D252 Subserosal leiomyoma of uterus: Secondary | ICD-10-CM | POA: Diagnosis not present

## 2018-09-19 DIAGNOSIS — N72 Inflammatory disease of cervix uteri: Secondary | ICD-10-CM | POA: Diagnosis not present

## 2018-09-19 DIAGNOSIS — D251 Intramural leiomyoma of uterus: Secondary | ICD-10-CM | POA: Diagnosis not present

## 2018-09-19 DIAGNOSIS — N8502 Endometrial intraepithelial neoplasia [EIN]: Secondary | ICD-10-CM | POA: Diagnosis present

## 2018-09-19 DIAGNOSIS — Z79899 Other long term (current) drug therapy: Secondary | ICD-10-CM | POA: Diagnosis not present

## 2018-09-19 DIAGNOSIS — Z01812 Encounter for preprocedural laboratory examination: Secondary | ICD-10-CM

## 2018-09-19 DIAGNOSIS — Z96652 Presence of left artificial knee joint: Secondary | ICD-10-CM | POA: Diagnosis not present

## 2018-09-19 DIAGNOSIS — N838 Other noninflammatory disorders of ovary, fallopian tube and broad ligament: Secondary | ICD-10-CM | POA: Diagnosis not present

## 2018-09-19 DIAGNOSIS — E785 Hyperlipidemia, unspecified: Secondary | ICD-10-CM | POA: Diagnosis not present

## 2018-09-19 DIAGNOSIS — F329 Major depressive disorder, single episode, unspecified: Secondary | ICD-10-CM | POA: Diagnosis not present

## 2018-09-19 LAB — BASIC METABOLIC PANEL
Anion gap: 8 (ref 5–15)
BUN: 13 mg/dL (ref 8–23)
CO2: 24 mmol/L (ref 22–32)
Calcium: 9.3 mg/dL (ref 8.9–10.3)
Chloride: 107 mmol/L (ref 98–111)
Creatinine, Ser: 0.67 mg/dL (ref 0.44–1.00)
GFR calc non Af Amer: >60 mL/min (ref >60)
Glucose, Bld: 80 mg/dL (ref 70–99)
Potassium: 3.9 mmol/L (ref 3.5–5.1)
Sodium: 139 mmol/L (ref 135–145)

## 2018-09-19 LAB — CBC
HCT: 42.5 % (ref 36.0–46.0)
Hemoglobin: 14.2 g/dL (ref 12.0–15.0)
MCH: 30.7 pg (ref 26.0–34.0)
MCHC: 33.4 g/dL (ref 30.0–36.0)
MCV: 91.8 fL (ref 80.0–100.0)
Platelets: 249 10*3/uL (ref 150–400)
RBC: 4.63 MIL/uL (ref 3.87–5.11)
RDW: 12.4 % (ref 11.5–15.5)
WBC: 5 10*3/uL (ref 4.0–10.5)
nRBC: 0 % (ref 0.0–0.2)

## 2018-09-19 LAB — TYPE AND SCREEN
ABO/RH(D): O POS
Antibody Screen: NEGATIVE

## 2018-09-19 NOTE — Patient Instructions (Addendum)
Your procedure is scheduled on: Friday September 22, 2018 Report to Day Surgery on the 2nd floor of the [facility]. To find out your arrival time, please call [phone] between 1PM - 3PM on: Thursday September 21, 2018  REMEMBER: Instructions that are not followed completely may result in serious medical risk, up to and including death; or upon the discretion of your surgeon and anesthesiologist your surgery may need to be rescheduled.  Do not eat food after midnight the night before surgery.  No gum chewing, lozengers or hard candies.  You may however, drink CLEAR liquids up to 2 hours before you are scheduled to arrive for your surgery. Do not drink anything within 2 hours of the start of your surgery.  Clear liquids include: - water  - apple juice without pulp - gatorade - black coffee or tea (Do NOT add milk or creamers to the coffee or tea) Do NOT drink anything that is not on this list.  Type 1 and Type 2 diabetics should only drink water.  No Alcohol for 24 hours before or after surgery.  No Smoking including e-cigarettes for 24 hours prior to surgery.  No chewable tobacco products for at least 6 hours prior to surgery.  No nicotine patches on the day of surgery.  On the morning of surgery brush your teeth with toothpaste and water, you may rinse your mouth with mouthwash if you wish. Do not swallow any toothpaste or mouthwash.  Notify your doctor if there is any change in your medical condition (cold, fever, infection).  Do not wear jewelry, make-up, hairpins, clips or nail polish.  Do not wear lotions, powders, or perfumes.   Do not shave 48 hours prior to surgery.   Contacts and dentures may not be worn into surgery.  Do not bring valuables to the hospital, including drivers license, insurance or credit cards.  [facility] is not responsible for any belongings or valuables.   TAKE THESE MEDICATIONS THE MORNING OF SURGERY: WELLBUTRIN CELEXA  Use CHG Soap  as  directed on instruction sheet.  Stop Anti-inflammatories (NSAIDS) such as Advil, Aleve, Ibuprofen, Motrin, Naproxen, Naprosyn and Aspirin based products such as Excedrin, Goodys Powder, BC Powder. (May take Tylenol or Acetaminophen if needed.)  Stop ANY OVER THE COUNTER supplements until after surgery. (May continue Vitamin D, Vitamin B, and multivitamin.)  Wear comfortable clothing (specific to your surgery type) to the hospital.  Plan for stool softeners for home use.  If you are being admitted to the hospital overnight, leave your suitcase in the car. After surgery it may be brought to your room.  If you are being discharged the day of surgery, you will not be allowed to drive home. You will need a responsible adult to drive you home and stay with you that night.   If you are taking public transportation, you will need to have a responsible adult with you. Please confirm with your physician that it is acceptable to use public transportation.   Please call [phone] if you have any questions about these instructions.

## 2018-09-22 ENCOUNTER — Encounter: Payer: Self-pay | Admitting: *Deleted

## 2018-09-22 ENCOUNTER — Other Ambulatory Visit: Payer: Self-pay

## 2018-09-22 ENCOUNTER — Encounter
Admission: RE | Disposition: A | Discharge status: Home / Self Care | Payer: Self-pay | Source: Home / Self Care | Attending: Obstetrics and Gynecology

## 2018-09-22 ENCOUNTER — Observation Stay
Admission: RE | Admit: 2018-09-22 | Discharge: 2018-09-23 | Disposition: A | Discharge status: Home / Self Care | Payer: Medicare Other | Attending: Obstetrics and Gynecology | Admitting: Obstetrics and Gynecology

## 2018-09-22 ENCOUNTER — Ambulatory Visit: Payer: Medicare Other | Admitting: Certified Registered"

## 2018-09-22 DIAGNOSIS — N72 Inflammatory disease of cervix uteri: Secondary | ICD-10-CM | POA: Insufficient documentation

## 2018-09-22 DIAGNOSIS — Z96652 Presence of left artificial knee joint: Secondary | ICD-10-CM | POA: Insufficient documentation

## 2018-09-22 DIAGNOSIS — D252 Subserosal leiomyoma of uterus: Secondary | ICD-10-CM | POA: Insufficient documentation

## 2018-09-22 DIAGNOSIS — D251 Intramural leiomyoma of uterus: Secondary | ICD-10-CM | POA: Insufficient documentation

## 2018-09-22 DIAGNOSIS — N8502 Endometrial intraepithelial neoplasia [EIN]: Principal | ICD-10-CM | POA: Diagnosis present

## 2018-09-22 DIAGNOSIS — Z9889 Other specified postprocedural states: Secondary | ICD-10-CM

## 2018-09-22 DIAGNOSIS — N838 Other noninflammatory disorders of ovary, fallopian tube and broad ligament: Secondary | ICD-10-CM | POA: Insufficient documentation

## 2018-09-22 DIAGNOSIS — F329 Major depressive disorder, single episode, unspecified: Secondary | ICD-10-CM | POA: Insufficient documentation

## 2018-09-22 DIAGNOSIS — E785 Hyperlipidemia, unspecified: Secondary | ICD-10-CM | POA: Insufficient documentation

## 2018-09-22 DIAGNOSIS — Z79899 Other long term (current) drug therapy: Secondary | ICD-10-CM | POA: Insufficient documentation

## 2018-09-22 HISTORY — PX: SALPINGOOPHORECTOMY: SHX82

## 2018-09-22 HISTORY — PX: VAGINAL HYSTERECTOMY: SHX2639

## 2018-09-22 LAB — BASIC METABOLIC PANEL
Anion gap: 7 (ref 5–15)
BUN: 12 mg/dL (ref 8–23)
CALCIUM: 8.6 mg/dL — AB (ref 8.9–10.3)
CO2: 25 mmol/L (ref 22–32)
Chloride: 106 mmol/L (ref 98–111)
Creatinine, Ser: 0.72 mg/dL (ref 0.44–1.00)
GFR calc Af Amer: >60 mL/min (ref >60)
GFR calc non Af Amer: >60 mL/min (ref >60)
Glucose, Bld: 116 mg/dL — ABNORMAL HIGH (ref 70–99)
Potassium: 3.9 mmol/L (ref 3.5–5.1)
Sodium: 138 mmol/L (ref 135–145)

## 2018-09-22 SURGERY — HYSTERECTOMY, VAGINAL
Anesthesia: General

## 2018-09-22 MED ORDER — LIDOCAINE HCL (PF) 2 % IJ SOLN
INTRAMUSCULAR | Status: AC
  Filled 2018-09-22: qty 10

## 2018-09-22 MED ORDER — LACTATED RINGERS IV SOLN
INTRAVENOUS | Status: DC
  Administered 2018-09-22 (×2): via INTRAVENOUS

## 2018-09-22 MED ORDER — PROMETHAZINE HCL 25 MG/ML IJ SOLN: 6.2500 mg | INTRAMUSCULAR | Status: DC | PRN

## 2018-09-22 MED ORDER — CEFAZOLIN SODIUM-DEXTROSE 2-4 GM/100ML-% IV SOLN
INTRAVENOUS | Status: AC
  Filled 2018-09-22: qty 100

## 2018-09-22 MED ORDER — CEFAZOLIN SODIUM-DEXTROSE 2-4 GM/100ML-% IV SOLN
2.0000 g | Freq: Once | INTRAVENOUS | Status: AC
  Administered 2018-09-22: 2 g via INTRAVENOUS

## 2018-09-22 MED ORDER — GABAPENTIN 300 MG PO CAPS
900.0000 mg | ORAL_CAPSULE | ORAL | Status: AC
  Administered 2018-09-22: 900 mg via ORAL

## 2018-09-22 MED ORDER — MEPERIDINE HCL 50 MG/ML IJ SOLN: 6.2500 mg | INTRAMUSCULAR | Status: DC | PRN

## 2018-09-22 MED ORDER — SCOPOLAMINE 1 MG/3DAYS TD PT72
1.0000 | MEDICATED_PATCH | TRANSDERMAL | Status: DC
  Administered 2018-09-22: 1.5 mg via TRANSDERMAL

## 2018-09-22 MED ORDER — PROPOFOL 10 MG/ML IV BOLUS
INTRAVENOUS | Status: DC | PRN
  Administered 2018-09-22: 150 mg via INTRAVENOUS

## 2018-09-22 MED ORDER — ONDANSETRON HCL 4 MG/2ML IJ SOLN
INTRAMUSCULAR | Status: DC | PRN
  Administered 2018-09-22: 4 mg via INTRAVENOUS

## 2018-09-22 MED ORDER — SUGAMMADEX SODIUM 200 MG/2ML IV SOLN
INTRAVENOUS | Status: AC
  Filled 2018-09-22: qty 2

## 2018-09-22 MED ORDER — LIDOCAINE-EPINEPHRINE 1 %-1:100000 IJ SOLN
INTRAMUSCULAR | Status: AC
  Filled 2018-09-22: qty 1

## 2018-09-22 MED ORDER — GABAPENTIN 300 MG PO CAPS
ORAL_CAPSULE | ORAL | Status: AC
  Administered 2018-09-22: 900 mg via ORAL
  Filled 2018-09-22: qty 3

## 2018-09-22 MED ORDER — ROCURONIUM BROMIDE 100 MG/10ML IV SOLN
INTRAVENOUS | Status: DC | PRN
  Administered 2018-09-22: 10 mg via INTRAVENOUS
  Administered 2018-09-22: 50 mg via INTRAVENOUS

## 2018-09-22 MED ORDER — FAMOTIDINE 20 MG PO TABS
20.0000 mg | ORAL_TABLET | Freq: Once | ORAL | Status: AC
  Administered 2018-09-22: 20 mg via ORAL

## 2018-09-22 MED ORDER — FENTANYL CITRATE (PF) 250 MCG/5ML IJ SOLN
INTRAMUSCULAR | Status: AC
  Filled 2018-09-22: qty 5

## 2018-09-22 MED ORDER — PHENYLEPHRINE HCL 10 MG/ML IJ SOLN
INTRAMUSCULAR | Status: AC
  Filled 2018-09-22: qty 1

## 2018-09-22 MED ORDER — MORPHINE SULFATE (PF) 2 MG/ML IV SOLN
1.0000 mg | INTRAVENOUS | Status: DC | PRN
  Administered 2018-09-22: 2 mg via INTRAVENOUS
  Filled 2018-09-22: qty 1

## 2018-09-22 MED ORDER — ONDANSETRON HCL 4 MG/2ML IJ SOLN
INTRAMUSCULAR | Status: AC
  Filled 2018-09-22: qty 2

## 2018-09-22 MED ORDER — ACETAMINOPHEN 500 MG PO TABS
1000.0000 mg | ORAL_TABLET | ORAL | Status: AC
  Administered 2018-09-22: 1000 mg via ORAL

## 2018-09-22 MED ORDER — DEXAMETHASONE SODIUM PHOSPHATE 10 MG/ML IJ SOLN
INTRAMUSCULAR | Status: AC
  Filled 2018-09-22: qty 1

## 2018-09-22 MED ORDER — DEXAMETHASONE SODIUM PHOSPHATE 10 MG/ML IJ SOLN
INTRAMUSCULAR | Status: DC | PRN
  Administered 2018-09-22: 10 mg via INTRAVENOUS

## 2018-09-22 MED ORDER — PROPOFOL 10 MG/ML IV BOLUS
INTRAVENOUS | Status: AC
  Filled 2018-09-22: qty 40

## 2018-09-22 MED ORDER — FENTANYL CITRATE (PF) 100 MCG/2ML IJ SOLN
INTRAMUSCULAR | Status: DC | PRN
  Administered 2018-09-22 (×2): 50 ug via INTRAVENOUS

## 2018-09-22 MED ORDER — LIDOCAINE-EPINEPHRINE 1 %-1:100000 IJ SOLN
INTRAMUSCULAR | Status: DC | PRN
  Administered 2018-09-22: 7 mL

## 2018-09-22 MED ORDER — FAMOTIDINE 20 MG PO TABS
ORAL_TABLET | ORAL | Status: AC
  Administered 2018-09-22: 20 mg via ORAL
  Filled 2018-09-22: qty 1

## 2018-09-22 MED ORDER — MIDAZOLAM HCL 2 MG/2ML IJ SOLN
INTRAMUSCULAR | Status: AC
  Filled 2018-09-22: qty 2

## 2018-09-22 MED ORDER — LACTATED RINGERS IV SOLN
INTRAVENOUS | Status: DC
  Administered 2018-09-22: 08:00:00 via INTRAVENOUS

## 2018-09-22 MED ORDER — METHYLENE BLUE 0.5 % INJ SOLN
INTRAVENOUS | Status: AC
  Filled 2018-09-22: qty 10

## 2018-09-22 MED ORDER — ACETAMINOPHEN 500 MG PO TABS
ORAL_TABLET | ORAL | Status: AC
  Administered 2018-09-22: 1000 mg via ORAL
  Filled 2018-09-22: qty 2

## 2018-09-22 MED ORDER — ONDANSETRON HCL 4 MG PO TABS: 4.0000 mg | ORAL_TABLET | Freq: Four times a day (QID) | ORAL | Status: DC | PRN

## 2018-09-22 MED ORDER — OXYCODONE HCL 5 MG PO TABS
5.0000 mg | ORAL_TABLET | ORAL | Status: DC | PRN
  Administered 2018-09-23: 5 mg via ORAL
  Filled 2018-09-22: qty 1

## 2018-09-22 MED ORDER — EPHEDRINE SULFATE 50 MG/ML IJ SOLN
INTRAMUSCULAR | Status: DC | PRN
  Administered 2018-09-22: 5 mg via INTRAVENOUS

## 2018-09-22 MED ORDER — OXYCODONE HCL 5 MG PO TABS: 5.0000 mg | ORAL_TABLET | Freq: Once | ORAL | Status: DC | PRN

## 2018-09-22 MED ORDER — SCOPOLAMINE 1 MG/3DAYS TD PT72
MEDICATED_PATCH | TRANSDERMAL | Status: AC
  Filled 2018-09-22: qty 1

## 2018-09-22 MED ORDER — CALCIUM CARBONATE ANTACID 500 MG PO CHEW
2.0000 | CHEWABLE_TABLET | Freq: Four times a day (QID) | ORAL | Status: DC | PRN
  Administered 2018-09-22 (×2): 400 mg via ORAL
  Filled 2018-09-22 (×3): qty 2

## 2018-09-22 MED ORDER — OXYCODONE-ACETAMINOPHEN 5-325 MG PO TABS: 1.0000 | ORAL_TABLET | ORAL | Status: DC | PRN

## 2018-09-22 MED ORDER — ROCURONIUM BROMIDE 50 MG/5ML IV SOLN
INTRAVENOUS | Status: AC
  Filled 2018-09-22: qty 1

## 2018-09-22 MED ORDER — PHENYLEPHRINE HCL 10 MG/ML IJ SOLN
INTRAMUSCULAR | Status: DC | PRN
  Administered 2018-09-22 (×5): 100 ug via INTRAVENOUS

## 2018-09-22 MED ORDER — LIDOCAINE HCL (CARDIAC) PF 100 MG/5ML IV SOSY
PREFILLED_SYRINGE | INTRAVENOUS | Status: DC | PRN
  Administered 2018-09-22: 100 mg via INTRAVENOUS

## 2018-09-22 MED ORDER — SUGAMMADEX SODIUM 200 MG/2ML IV SOLN
INTRAVENOUS | Status: DC | PRN
  Administered 2018-09-22: 167 mg via INTRAVENOUS

## 2018-09-22 MED ORDER — MIDAZOLAM HCL 2 MG/2ML IJ SOLN
INTRAMUSCULAR | Status: DC | PRN
  Administered 2018-09-22: 2 mg via INTRAVENOUS

## 2018-09-22 MED ORDER — STERILE WATER FOR IRRIGATION IR SOLN
Status: DC | PRN
  Administered 2018-09-22: 10 mL via INTRAVESICAL

## 2018-09-22 MED ORDER — ONDANSETRON HCL 4 MG/2ML IJ SOLN: 4.0000 mg | Freq: Four times a day (QID) | INTRAMUSCULAR | Status: DC | PRN

## 2018-09-22 MED ORDER — ACETAMINOPHEN 500 MG PO TABS
1000.0000 mg | ORAL_TABLET | Freq: Four times a day (QID) | ORAL | Status: DC
  Administered 2018-09-22 – 2018-09-23 (×4): 1000 mg via ORAL
  Filled 2018-09-22 (×4): qty 2

## 2018-09-22 MED ORDER — FENTANYL CITRATE (PF) 100 MCG/2ML IJ SOLN: 25.0000 ug | INTRAMUSCULAR | Status: DC | PRN

## 2018-09-22 MED ORDER — OXYCODONE HCL 5 MG/5ML PO SOLN: 5.0000 mg | Freq: Once | ORAL | Status: DC | PRN

## 2018-09-22 SURGICAL SUPPLY — 37 items
BAG URINE DRAINAGE (UROLOGICAL SUPPLIES) ×4 IMPLANT
CANISTER SUCT 1200ML W/VALVE (MISCELLANEOUS) ×4 IMPLANT
CATH FOLEY 2WAY  5CC 16FR (CATHETERS) ×2
CATH ROBINSON RED A/P 16FR (CATHETERS) ×4 IMPLANT
CATH URTH 16FR FL 2W BLN LF (CATHETERS) ×2 IMPLANT
COVER WAND RF STERILE (DRAPES) ×4 IMPLANT
DRAPE PERI LITHO V/GYN (MISCELLANEOUS) ×4 IMPLANT
DRAPE SURG 17X11 SM STRL (DRAPES) ×4 IMPLANT
DRAPE UNDER BUTTOCK W/FLU (DRAPES) ×4 IMPLANT
ELECT REM PT RETURN 9FT ADLT (ELECTROSURGICAL) ×4
ELECTRODE REM PT RTRN 9FT ADLT (ELECTROSURGICAL) ×2 IMPLANT
GLOVE BIO SURGEON STRL SZ8 (GLOVE) ×4 IMPLANT
GOWN STRL REUS W/ TWL LRG LVL3 (GOWN DISPOSABLE) ×6 IMPLANT
GOWN STRL REUS W/ TWL XL LVL3 (GOWN DISPOSABLE) ×2 IMPLANT
GOWN STRL REUS W/TWL LRG LVL3 (GOWN DISPOSABLE) ×6
GOWN STRL REUS W/TWL XL LVL3 (GOWN DISPOSABLE) ×2
KIT TURNOVER CYSTO (KITS) ×4 IMPLANT
LABEL OR SOLS (LABEL) ×4 IMPLANT
NEEDLE HYPO 22GX1.5 SAFETY (NEEDLE) ×4 IMPLANT
PACK BASIN MINOR ARMC (MISCELLANEOUS) ×4 IMPLANT
PAD OB MATERNITY 4.3X12.25 (PERSONAL CARE ITEMS) ×4 IMPLANT
PAD PREP 24X41 OB/GYN DISP (PERSONAL CARE ITEMS) ×4 IMPLANT
SOL PREP PVP 2OZ (MISCELLANEOUS) ×4
SOLUTION PREP PVP 2OZ (MISCELLANEOUS) ×2 IMPLANT
SURGILUBE 2OZ TUBE FLIPTOP (MISCELLANEOUS) ×4 IMPLANT
SUT PDS 2-0 27IN (SUTURE) ×4 IMPLANT
SUT VIC AB 0 CT1 27 (SUTURE) ×8
SUT VIC AB 0 CT1 27XCR 8 STRN (SUTURE) ×8 IMPLANT
SUT VIC AB 0 CT1 36 (SUTURE) ×8 IMPLANT
SUT VIC AB 2-0 SH 27 (SUTURE) ×6
SUT VIC AB 2-0 SH 27XBRD (SUTURE) ×6 IMPLANT
SUT VIC AB 3-0 SH 27 (SUTURE) ×4
SUT VIC AB 3-0 SH 27X BRD (SUTURE) ×4 IMPLANT
SYR 10ML LL (SYRINGE) ×4 IMPLANT
SYR CONTROL 10ML (SYRINGE) ×4 IMPLANT
SYRINGE IRR TOOMEY STRL 70CC (SYRINGE) ×4 IMPLANT
WATER STERILE IRR 1000ML POUR (IV SOLUTION) ×4 IMPLANT

## 2018-09-22 NOTE — Progress Notes (Signed)
Patient ID: Holly Mclaughlin, female   DOB: 08/14/1951, 68 y.o.   MRN: [id number] DOS  Doing well . No c/o  UO adequate  VSS  will keep overnight

## 2018-09-22 NOTE — Anesthesia Postprocedure Evaluation (Signed)
Anesthesia Post Note  Patient: Holly Mclaughlin  Procedure(s) Performed: HYSTERECTOMY VAGINAL (N/A ) SALPINGO OOPHORECTOMY (Bilateral )  Patient location during evaluation: PACU Anesthesia Type: General Level of consciousness: awake and alert and oriented Pain management: pain level controlled Vital Signs Assessment: post-procedure vital signs reviewed and stable Respiratory status: spontaneous breathing, nonlabored ventilation and respiratory function stable Cardiovascular status: blood pressure returned to baseline and stable Postop Assessment: no signs of nausea or vomiting Anesthetic complications: no     Last Vitals:  Vitals:   09/22/18 1252 09/22/18 1349  BP: (!) 102/49 (!) 101/53  Pulse: (!) 59 63  Resp: 16 18  Temp: 36.7 C (!) 36.3 C  SpO2: 96% 96%    Last Pain:  Vitals:   09/22/18 1349  TempSrc: Oral  PainSc:                  Holly Mclaughlin

## 2018-09-22 NOTE — Transfer of Care (Signed)
Immediate Anesthesia Transfer of Care Note  Patient: Holly Mclaughlin  Procedure(s) Performed: HYSTERECTOMY VAGINAL (N/A ) SALPINGO OOPHORECTOMY (Bilateral )  Patient Location: PACU  Anesthesia Type:General  Level of Consciousness: sedated  Airway & Oxygen Therapy: Patient Spontanous Breathing and Patient connected to face mask oxygen  Post-op Assessment: Report given to RN and Post -op Vital signs reviewed and stable  Post vital signs: Reviewed and stable  Last Vitals:  Vitals Value Taken Time  BP    Temp    Pulse    Resp    SpO2      Last Pain:  Vitals:   09/22/18 0810  TempSrc: Tympanic  PainSc: 0-No pain         Complications: No apparent anesthesia complications

## 2018-09-22 NOTE — Anesthesia Post-op Follow-up Note (Signed)
Anesthesia QCDR form completed.        

## 2018-09-22 NOTE — Brief Op Note (Signed)
09/22/2018  11:27 AM  PATIENT:  Holly Mclaughlin  68 y.o. female  PRE-OPERATIVE DIAGNOSIS:  aypical endometrial hyperplasia ( EIN) POST-OPERATIVE DIAGNOSIS:  aypical endometrial hyperplasia (EIN) PROCEDURE:  TVH and left salpingectomy  SURGEON:  Surgeon(s) and Role:    * Chesnee Floren, Gwen Her, MD - Primary    * Ward, Honor Loh, MD - Assisting  PHYSICIAN ASSISTANT: scrub tech   ASSISTANTS: none   ANESTHESIA:   general  EBL:  10 mL , IOF 700 cc, UO 300 cc  BLOOD ADMINISTERED:none  DRAINS: Urinary Catheter (Foley)   LOCAL MEDICATIONS USED:  LIDOCAINE   SPECIMEN:  Source of Specimen:  cervix , uterus and left fallopian tube  DISPOSITION OF SPECIMEN:  PATHOLOGY  COUNTS:  YES  TOURNIQUET:  * No tourniquets in log *  DICTATION: .Other Dictation: Dictation Number verbal  PLAN OF CARE: Admit for overnight observation  PATIENT DISPOSITION:  PACU - hemodynamically stable.   Delay start of Pharmacological VTE agent (>24hrs) due to surgical blood loss or risk of bleeding: not applicable

## 2018-09-22 NOTE — Progress Notes (Signed)
Verified with Dr. Ouida Sills OK to remove foley catheter if pt would like it removed today.

## 2018-09-22 NOTE — Op Note (Signed)
NAMEVIVIANE, Holly Mclaughlin MEDICAL RECORD [id number] ACCOUNT [id number] DATE OF BIRTH:1951-06-03 FACILITY: [facility] LOCATION: [facility] PHYSICIAN:Namon Villarin Josefine Class, MD  OPERATIVE REPORT  DATE OF PROCEDURE:  09/22/2018  PREOPERATIVE DIAGNOSES:  Atypical endometrial intraepithelial neoplasia.  POSTOPERATIVE DIAGNOSES:  Atypical endometrial intraepithelial neoplasia.  PROCEDURE: 1.  Total vaginal hysterectomy. 2.  Left salpingectomy.  ANESTHESIA:  General endotracheal anesthesia.  SURGEON:  Laverta Baltimore, MD  FIRST ASSISTANT:  Larey Days, MD  INDICATIONS:  A 68 year old female who underwent a fractional dilation and curettage and MyoSure resection of an endometrial polyp with pathology showing atypical endometrial intraepithelial neoplasia.  The patient has elected for definitive surgery  given the potential risk for endometrial cancer.  DESCRIPTION OF PROCEDURE:  After adequate general endotracheal anesthesia, the patient was placed in dorsal supine position.  Legs were placed in the candy cane stirrups.  Abdomen, perineum and vagina were prepped and draped in normal sterile fashion.   Timeout was performed.  The patient did receive 2 grams IV Ancef prior to commencement of the case.  A weighted speculum was placed in the posterior vaginal vault after draining the bladder yielding 250 mL of clear urine.  The cervix was grasped with 2  thyroid tenacula.  Cervix was circumferentially injected with 0.5% lidocaine with 1:100,000 epinephrine.  A direct posterior colpotomy incision was made.  Upon entry into the posterior cul-de-sac, the uterosacral ligaments were bilaterally clamped,  transected, suture ligated with 0 Vicryl suture.  Anterior cervix was incised with the Bovie. The cardinal ligaments were then bilaterally clamped, transected, suture ligated with 0 Vicryl suture.  Uterine arteries were then bilaterally clamped,  transected, suture ligated with 0 Vicryl suture.  The  anterior cul-de-sac was then entered sharply.  During the dissection, there was noted weakening of the bladder muscularis.  The uterine cornua were then bilaterally clamped, transected, suture ligated  with 0 Vicryl suture and the uterus was delivered.  The cornual pedicles were doubly ligated.  The left fallopian tube was identified and grasped with a Babcock clamp and was then clamped at the mesosalpinx and excised and the pedicle was secured with 0  Vicryl suture.  The left ovary, the right ovary and the right fallopian tube could not be identified otherwise.  Good hemostasis was noted.  The bladder was then retrograde filled with saline with methylene blue, which showed some weakening of the  muscularis of the bladder.  Two separate sutures utilizing 3-0 Vicryl were used to reinforce this weakened area.  Good support of the tissues was noted and the bladder was refilled with no additional weakening of the bladder tissue.  The vaginal vault  was then closed with an 0 Vicryl suture running nonlocking  Uterosacral ligaments were plicated centrally and the rest of the vaginal vault was closed with 0 Vicryl suture.  The Foley catheter was placed in the bladder with good return of saline with  methylene blue.  DISPOSITION:  The patient tolerated the procedure well and was taken to recovery room in good condition.  ESTIMATED BLOOD LOSS:  10 mL  INTRAOPERATIVE FLUIDS:  700 mL  URINE OUTPUT:  300 mL  TN/NUANCE  D:09/22/2018 T:09/22/2018 JOB:[id number]

## 2018-09-22 NOTE — Progress Notes (Signed)
Pt ready for TVH possible BSO  Labs reviewed  All questions answered

## 2018-09-22 NOTE — Anesthesia Procedure Notes (Signed)
Procedure Name: Intubation Date/Time: 09/22/2018 9:58 AM Performed by: Lavone Orn, CRNA Pre-anesthesia Checklist: Patient identified, Emergency Drugs available, Suction available, Patient being monitored and Timeout performed Patient Re-evaluated:Patient Re-evaluated prior to induction Oxygen Delivery Method: Circle system utilized Preoxygenation: Pre-oxygenation with 100% oxygen Induction Type: IV induction Ventilation: Mask ventilation without difficulty and Oral airway inserted - appropriate to patient size Laryngoscope Size: Mac and 3 Grade View: Grade II Tube type: Oral Tube size: 7.0 mm Number of attempts: 1 Airway Equipment and Method: Stylet (Cricoid pressure) Placement Confirmation: ETT inserted through vocal cords under direct vision,  positive ETCO2 and breath sounds checked- equal and bilateral Secured at: 21 cm Tube secured with: Tape Dental Injury: Teeth and Oropharynx as per pre-operative assessment  Difficulty Due To: Difficult Airway- due to reduced neck mobility and Difficult Airway- due to limited oral opening

## 2018-09-22 NOTE — Anesthesia Preprocedure Evaluation (Signed)
Anesthesia Evaluation  Patient identified by MRN, date of birth, ID band Patient awake    Reviewed: Allergy & Precautions, NPO status , Patient's Chart, lab work & pertinent test results  History of Anesthesia Complications Negative for: history of anesthetic complications  Airway Mallampati: III  TM Distance: >3 FB Neck ROM: Full    Dental no notable dental hx.    Pulmonary neg pulmonary ROS, neg sleep apnea, neg COPD,    breath sounds clear to auscultation- rhonchi (-) wheezing      Cardiovascular Exercise Tolerance: Good (-) hypertension(-) CAD, (-) Past MI, (-) Cardiac Stents and (-) CABG  Rhythm:Regular Rate:Normal - Systolic murmurs and - Diastolic murmurs    Neuro/Psych neg Seizures PSYCHIATRIC DISORDERS Depression negative neurological ROS     GI/Hepatic Neg liver ROS, GERD  ,  Endo/Other  negative endocrine ROSneg diabetes  Renal/GU negative Renal ROS     Musculoskeletal  (+) Arthritis ,   Abdominal (+) + obese,   Peds  Hematology negative hematology ROS (+)   Anesthesia Other Findings Past Medical History: No date: Collagen vascular disease (HCC)     Comment:  RA No date: Depression No date: GERD (gastroesophageal reflux disease) No date: Hyperlipidemia   Reproductive/Obstetrics                             Anesthesia Physical Anesthesia Plan  ASA: II  Anesthesia Plan: General   Post-op Pain Management:    Induction: Intravenous  PONV Risk Score and Plan: 2 and Ondansetron, Dexamethasone, Scopolamine patch - Pre-op and Midazolam  Airway Management Planned: Oral ETT  Additional Equipment:   Intra-op Plan:   Post-operative Plan: Extubation in OR  Informed Consent: I have reviewed the patients History and Physical, chart, labs and discussed the procedure including the risks, benefits and alternatives for the proposed anesthesia with the patient or authorized  representative who has indicated his/her understanding and acceptance.   Dental advisory given  Plan Discussed with: CRNA and Anesthesiologist  Anesthesia Plan Comments:         Anesthesia Quick Evaluation

## 2018-09-23 ENCOUNTER — Encounter: Payer: Self-pay | Admitting: Obstetrics and Gynecology

## 2018-09-23 DIAGNOSIS — N8502 Endometrial intraepithelial neoplasia [EIN]: Secondary | ICD-10-CM | POA: Diagnosis not present

## 2018-09-23 LAB — CBC
HCT: 35.1 % — ABNORMAL LOW (ref 36.0–46.0)
HEMOGLOBIN: 11.8 g/dL — AB (ref 12.0–15.0)
MCH: 29.9 pg (ref 26.0–34.0)
MCHC: 33.6 g/dL (ref 30.0–36.0)
MCV: 89.1 fL (ref 80.0–100.0)
Platelets: 220 10*3/uL (ref 150–400)
RBC: 3.94 MIL/uL (ref 3.87–5.11)
RDW: 12 % (ref 11.5–15.5)
WBC: 12 10*3/uL — ABNORMAL HIGH (ref 4.0–10.5)
nRBC: 0 % (ref 0.0–0.2)

## 2018-09-23 MED ORDER — DOCUSATE SODIUM 100 MG PO CAPS: 100.0000 mg | ORAL_CAPSULE | Freq: Every day | ORAL | 2 refills | Status: AC | PRN

## 2018-09-23 MED ORDER — HYDROCODONE-ACETAMINOPHEN 5-325 MG PO TABS: 1.0000 | ORAL_TABLET | ORAL | 0 refills | Status: AC | PRN

## 2018-09-23 MED ORDER — IBUPROFEN 600 MG PO TABS: 600.0000 mg | ORAL_TABLET | Freq: Three times a day (TID) | ORAL | 1 refills | Status: DC | PRN

## 2018-09-23 MED ORDER — ONDANSETRON HCL 4 MG PO TABS: 4.0000 mg | ORAL_TABLET | Freq: Four times a day (QID) | ORAL | 0 refills | Status: DC | PRN

## 2018-09-23 NOTE — Plan of Care (Signed)
Patient's vital signs stable; voiding; good po fluids; good appetite; pain controlled with po tylenol and po oxycodone; ambulated in hall with nurse assist; IV fluids continued; IV site clear.

## 2018-09-23 NOTE — Progress Notes (Signed)
AT 0425 am, patient woke up from sleep and thought she was at "home". She went to the exit door (right outside her room) looking for the "kitchen". Then she went to the bathroom and voided and accidentally pulled out her IV; site clear. Patient called the nurse to notify her of accidentally pulling her IV out. Nurse found patient oriented X 3 on arrival to room. Patient told nurse what happened. Nurse set patient's bed alarm. Nurse re-instructed patient to call nurse (via call button) for help before getting out of bed. Nurse stayed with patient for 30 minutes talking with her and having patient drink water. At 0515 am, patient observed sleeping in bed with siderails up X 2; respirations unlabored.

## 2018-09-23 NOTE — Progress Notes (Signed)
Patient discharged to home. Medications/Prescriptions and discharge instructions reviewed with patient who verbalized understanding. Pt discharged with family via W/C. Will schedule follow-up as instructed. 

## 2018-09-23 NOTE — Discharge Summary (Signed)
Physician Discharge Summary  Patient ID: Holly Mclaughlin MRN: [id number] DOB/AGE: [id number] 66 y.o.  Admit date: 09/22/2018 Discharge date: 09/23/2018  Admission Diagnoses:EIN    Discharge Diagnoses: same  Active Problems:   Atypical endometrial hyperplasia   Postoperative state   Discharged Condition: good  Hospital Course: uncomplicated  TVH and left salpingectomy  Normal labs on d/c . Ambulating and eating well   Consults: None  Significant Diagnostic Studies: labs:  Results for orders placed or performed during the hospital encounter of 09/22/18 (from the past 24 hour(s))  Basic metabolic panel     Status: Abnormal   Collection Time: 09/22/18  1:26 PM  Result Value Ref Range   Sodium 138 135 - 145 mmol/L   Potassium 3.9 3.5 - 5.1 mmol/L   Chloride 106 98 - 111 mmol/L   CO2 25 22 - 32 mmol/L   Glucose, Bld 116 (H) 70 - 99 mg/dL   BUN 12 8 - 23 mg/dL   Creatinine, Ser 0.72 0.44 - 1.00 mg/dL   Calcium 8.6 (L) 8.9 - 10.3 mg/dL   GFR calc non Af Amer >60 >60 mL/min   GFR calc Af Amer >60 >60 mL/min   Anion gap 7 5 - 15  CBC     Status: Abnormal   Collection Time: 09/23/18  5:24 AM  Result Value Ref Range   WBC 12.0 (H) 4.0 - 10.5 K/uL   RBC 3.94 3.87 - 5.11 MIL/uL   Hemoglobin 11.8 (L) 12.0 - 15.0 g/dL   HCT 35.1 (L) 36.0 - 46.0 %   MCV 89.1 80.0 - 100.0 fL   MCH 29.9 26.0 - 34.0 pg   MCHC 33.6 30.0 - 36.0 g/dL   RDW 12.0 11.5 - 15.5 %   Platelets 220 150 - 400 K/uL   nRBC 0.0 0.0 - 0.2 %     Treatments: surgery:as above  Discharge Exam: Blood pressure (!) 114/52, pulse 68, temperature 98.5 F (36.9 C), temperature source Oral, resp. rate 18, height 4\' 11"  (1.499 m), weight 83.5 kg, SpO2 93 %. General appearance: alert and cooperative Resp: clear to auscultation bilaterally Cardio: regular rate and rhythm, S1, S2 normal, no murmur, click, rub or gallop GI: soft, non-tender; bowel sounds normal; no masses,  no organomegaly  Disposition: Discharge  disposition: 01-Home or Self Care       Discharge Instructions    Call MD for:  difficulty breathing, headache or visual disturbances   Complete by:  As directed    Call MD for:  extreme fatigue   Complete by:  As directed    Call MD for:  hives   Complete by:  As directed    Call MD for:  persistant dizziness or light-headedness   Complete by:  As directed    Call MD for:  persistant nausea and vomiting   Complete by:  As directed    Call MD for:  redness, tenderness, or signs of infection (pain, swelling, redness, odor or green/yellow discharge around incision site)   Complete by:  As directed    Call MD for:  severe uncontrolled pain   Complete by:  As directed    Call MD for:  temperature >100.4   Complete by:  As directed    Diet - low sodium heart healthy   Complete by:  As directed    Increase activity slowly   Complete by:  As directed      Allergies as of 09/23/2018      Reactions  Lorazepam Itching      Medication List    TAKE these medications   acetaminophen 500 MG tablet Commonly known as:  TYLENOL Take 500 mg by mouth 2 (two) times daily.   atorvastatin 20 MG tablet Commonly known as:  LIPITOR Take 20 mg by mouth daily.   buPROPion 150 MG 24 hr tablet Commonly known as:  WELLBUTRIN XL Take 150 mg by mouth daily.   cimetidine 200 MG tablet Commonly known as:  TAGAMET Take 200 mg by mouth daily as needed (heartburn).   citalopram 20 MG tablet Commonly known as:  CELEXA Take 20 mg by mouth daily.   cyclobenzaprine 5 MG tablet Commonly known as:  FLEXERIL Take 5 mg by mouth 3 (three) times daily as needed for muscle spasms.   docusate sodium 100 MG capsule Commonly known as:  COLACE Take 1 capsule (100 mg total) by mouth daily as needed. What changed:  reasons to take this   HYDROcodone-acetaminophen 5-325 MG tablet Commonly known as:  NORCO/VICODIN Take 1 tablet by mouth every 4 (four) hours as needed for moderate pain.   ibuprofen 600  MG tablet Commonly known as:  ADVIL,MOTRIN Take 1 tablet (600 mg total) by mouth every 8 (eight) hours as needed for moderate pain.   ondansetron 4 MG tablet Commonly known as:  ZOFRAN Take 1 tablet (4 mg total) by mouth every 6 (six) hours as needed for nausea.   simethicone 125 MG chewable tablet Commonly known as:  MYLICON Chew 169 mg by mouth every 6 (six) hours as needed for flatulence.        Signed: Gwen Her Chelle [place] 09/23/2018, 9:49 AM

## 2018-09-25 LAB — SURGICAL PATHOLOGY

## 2019-03-07 DIAGNOSIS — Z90710 Acquired absence of both cervix and uterus: Secondary | ICD-10-CM | POA: Insufficient documentation

## 2019-03-07 DIAGNOSIS — M85851 Other specified disorders of bone density and structure, right thigh: Secondary | ICD-10-CM | POA: Insufficient documentation

## 2019-06-22 ENCOUNTER — Other Ambulatory Visit: Payer: Self-pay

## 2019-06-22 DIAGNOSIS — Z20822 Contact with and (suspected) exposure to covid-19: Secondary | ICD-10-CM

## 2019-06-23 LAB — NOVEL CORONAVIRUS, NAA: SARS-CoV-2, NAA: NOT DETECTED

## 2019-08-01 ENCOUNTER — Other Ambulatory Visit: Payer: Self-pay | Admitting: Family Medicine

## 2019-08-01 DIAGNOSIS — Z1231 Encounter for screening mammogram for malignant neoplasm of breast: Secondary | ICD-10-CM

## 2019-08-15 ENCOUNTER — Other Ambulatory Visit: Payer: Self-pay

## 2019-08-15 ENCOUNTER — Encounter (INDEPENDENT_AMBULATORY_CARE_PROVIDER_SITE_OTHER): Payer: Self-pay

## 2019-08-15 ENCOUNTER — Ambulatory Visit
Admission: RE | Admit: 2019-08-15 | Discharge: 2019-08-15 | Disposition: A | Discharge status: Home / Self Care | Payer: Medicare Other | Source: Ambulatory Visit | Attending: Family Medicine | Admitting: Family Medicine

## 2019-08-15 DIAGNOSIS — Z1231 Encounter for screening mammogram for malignant neoplasm of breast: Secondary | ICD-10-CM | POA: Insufficient documentation

## 2020-04-22 ENCOUNTER — Other Ambulatory Visit: Payer: Self-pay | Admitting: Family Medicine

## 2020-04-22 DIAGNOSIS — Z780 Asymptomatic menopausal state: Secondary | ICD-10-CM

## 2020-07-15 ENCOUNTER — Other Ambulatory Visit: Payer: Self-pay | Admitting: Family Medicine

## 2020-07-15 DIAGNOSIS — Z1231 Encounter for screening mammogram for malignant neoplasm of breast: Secondary | ICD-10-CM

## 2020-07-29 ENCOUNTER — Other Ambulatory Visit: Payer: Self-pay | Admitting: Family Medicine

## 2020-07-29 DIAGNOSIS — Z1231 Encounter for screening mammogram for malignant neoplasm of breast: Secondary | ICD-10-CM

## 2020-09-01 ENCOUNTER — Ambulatory Visit
Admission: RE | Admit: 2020-09-01 | Discharge: 2020-09-01 | Disposition: A | Discharge status: Home / Self Care | Payer: Medicare Other | Source: Ambulatory Visit | Attending: Family Medicine | Admitting: Family Medicine

## 2020-09-01 ENCOUNTER — Other Ambulatory Visit: Payer: Self-pay

## 2020-09-01 DIAGNOSIS — Z1231 Encounter for screening mammogram for malignant neoplasm of breast: Secondary | ICD-10-CM | POA: Diagnosis present

## 2020-11-08 ENCOUNTER — Ambulatory Visit (INDEPENDENT_AMBULATORY_CARE_PROVIDER_SITE_OTHER): Payer: Medicare Other

## 2020-11-08 ENCOUNTER — Encounter: Payer: Self-pay | Admitting: Emergency Medicine

## 2020-11-08 ENCOUNTER — Other Ambulatory Visit: Payer: Self-pay

## 2020-11-08 ENCOUNTER — Ambulatory Visit
Admission: EM | Admit: 2020-11-08 | Discharge: 2020-11-08 | Disposition: A | Discharge status: Home / Self Care | Payer: Medicare Other | Attending: Physician Assistant | Admitting: Physician Assistant

## 2020-11-08 DIAGNOSIS — U071 COVID-19: Secondary | ICD-10-CM

## 2020-11-08 DIAGNOSIS — R197 Diarrhea, unspecified: Secondary | ICD-10-CM

## 2020-11-08 DIAGNOSIS — R059 Cough, unspecified: Secondary | ICD-10-CM

## 2020-11-08 DIAGNOSIS — R112 Nausea with vomiting, unspecified: Secondary | ICD-10-CM

## 2020-11-08 DIAGNOSIS — J1282 Pneumonia due to coronavirus disease 2019: Secondary | ICD-10-CM

## 2020-11-08 DIAGNOSIS — R5383 Other fatigue: Secondary | ICD-10-CM | POA: Diagnosis present

## 2020-11-08 LAB — RESP PANEL BY RT-PCR (FLU A&B, COVID) ARPGX2
Influenza A by PCR: NEGATIVE
Influenza B by PCR: NEGATIVE
SARS Coronavirus 2 by RT PCR: POSITIVE — AB

## 2020-11-08 MED ORDER — CHERATUSSIN AC 100-10 MG/5ML PO SOLN: 10.0000 mL | Freq: Four times a day (QID) | ORAL | 0 refills | Status: AC | PRN

## 2020-11-08 MED ORDER — ONDANSETRON 8 MG PO TBDP
8.0000 mg | ORAL_TABLET | Freq: Once | ORAL | Status: AC
  Administered 2020-11-08: 8 mg via ORAL

## 2020-11-08 MED ORDER — ONDANSETRON 8 MG PO TBDP: 8.0000 mg | ORAL_TABLET | Freq: Three times a day (TID) | ORAL | 0 refills | Status: AC | PRN

## 2020-11-08 NOTE — ED Provider Notes (Signed)
[facility]    CSN: [id number] Arrival date & time: 11/08/20  1230      History   Chief Complaint Chief Complaint  Patient presents with  . Diarrhea  . Cough  . Emesis    HPI Holly Mclaughlin is a 69 y.o. female presenting for approximately 1 week history of fatigue, chills, cough (that is sometimes productive of yellow sputum), diarrhea, nausea, and low-grade fevers.  Patient started to have some vomiting when she arrived to [facility] urgent care today.  She believes it is because she got motion sick on the way over to the clinic.  Patient states her husband was sick with similar symptoms and she thought he might have COVID-19, but he never got tested or checked out. Patient has had 2 doses of Covid vaccine, but no booster.  Patient says that her cousin was recently admitted to the hospital with pneumonia but she denies a Covid diagnosis.  Patient has been around her cousin in the hospital.  Patient has been taking NyQuil intermittently for cough, but no other over-the-counter medicines.  She denies any high-grade fever, weakness, body aches, chest pain, wheezing or breathing difficulty. Patient's past medical history is significant for collagen vascular disease and hyperlipidemia.  She has no other concerns today.  HPI  Past Medical History:  Diagnosis Date  . Collagen vascular disease (HCC)    RA  . Depression   . GERD (gastroesophageal reflux disease)   . Hyperlipidemia     Patient Active Problem List   Diagnosis Date Noted  . Atypical endometrial hyperplasia 09/22/2018  . Postoperative state 09/22/2018  . Pure hypercholesterolemia 08/01/2018  . Arthritis 08/01/2018  . Abdominal aortic atherosclerosis ([place]) 08/01/2018    Past Surgical History:  Procedure Laterality Date  . ABDOMINAL HYSTERECTOMY    . DILATATION & CURETTAGE/HYSTEROSCOPY WITH MYOSURE N/A 08/11/2018   Procedure: DILATATION & CURETTAGE/HYSTEROSCOPY WITH MYOSURE;  Surgeon: Schermerhorn, Gwen Her,  MD;  Location: [facility] ORS;  Service: Gynecology;  Laterality: N/A;  . JOINT REPLACEMENT Left 2014   knee  . REPLACEMENT TOTAL KNEE Left 2014  . SALPINGOOPHORECTOMY Bilateral 09/22/2018   Procedure: SALPINGO OOPHORECTOMY;  Surgeon: Schermerhorn, Gwen Her, MD;  Location: [facility] ORS;  Service: Gynecology;  Laterality: Bilateral;  . TONSILLECTOMY    . TUBAL LIGATION    . VAGINAL HYSTERECTOMY N/A 09/22/2018   Procedure: HYSTERECTOMY VAGINAL;  Surgeon: Schermerhorn, Gwen Her, MD;  Location: [facility] ORS;  Service: Gynecology;  Laterality: N/A;    OB History   No obstetric history on file.      Home Medications    Prior to Admission medications   Medication Sig Start Date End Date Taking? Authorizing Provider  acetaminophen (TYLENOL) 500 MG tablet Take 500 mg by mouth 2 (two) times daily.   Yes [provider]  atorvastatin (LIPITOR) 20 MG tablet Take 20 mg by mouth daily.  07/20/18  Yes [provider]  buPROPion (WELLBUTRIN XL) 150 MG 24 hr tablet Take 150 mg by mouth daily. 07/14/18  Yes [provider]  cimetidine (TAGAMET) 200 MG tablet Take 200 mg by mouth daily as needed (heartburn).    Yes [provider]  citalopram (CELEXA) 20 MG tablet Take 20 mg by mouth daily.   Yes [provider]  cyclobenzaprine (FLEXERIL) 5 MG tablet Take 5 mg by mouth 3 (three) times daily as needed for muscle spasms.   Yes [provider]  guaiFENesin-codeine (CHERATUSSIN AC) 100-10 MG/5ML syrup Take 10 mLs by mouth 4 (  four) times daily as needed for up to 7 days for cough. 11/08/20 11/15/20 Yes Danton Clap, PA-C  ibuprofen (ADVIL,MOTRIN) 600 MG tablet Take 1 tablet (600 mg total) by mouth every 8 (eight) hours as needed for moderate pain. 09/23/18  Yes Schermerhorn, Gwen Her, MD  ondansetron (ZOFRAN ODT) 8 MG disintegrating tablet Take 1 tablet (8 mg total) by mouth every 8 (eight) hours as needed for up to 7 days for nausea or vomiting. 11/08/20 11/15/20 Yes Danton Clap, PA-C  simethicone (MYLICON) 478 MG chewable tablet Chew 125 mg by mouth every 6 (six) hours as needed for flatulence.   Yes [provider]  ondansetron (ZOFRAN) 4 MG tablet Take 1 tablet (4 mg total) by mouth every 6 (six) hours as needed for nausea. 09/23/18   Schermerhorn, Gwen Her, MD    Family History Family History  Problem Relation Age of Onset  . Breast cancer Paternal Aunt 30  . Breast cancer Paternal Grandmother 65  . Breast cancer Cousin        2 pat cousins  . Heart attack Mother   . Heart disease Mother   . Heart attack Father   . Stroke Father     Social History Social History   Tobacco Use  . Smoking status: Never Smoker  . Smokeless tobacco: Never Used  Vaping Use  . Vaping Use: Never used  Substance Use Topics  . Alcohol use: Yes    Comment: social  . Drug use: Never     Allergies   Lorazepam   Review of Systems Review of Systems  Constitutional: Positive for appetite change, chills, fatigue and fever. Negative for diaphoresis.  HENT: Positive for congestion. Negative for ear pain, rhinorrhea, sinus pressure, sinus pain and sore throat.   Respiratory: Positive for cough. Negative for shortness of breath.   Cardiovascular: Negative for chest pain and palpitations.  Gastrointestinal: Positive for diarrhea, nausea and vomiting. Negative for abdominal pain.  Musculoskeletal: Negative for arthralgias and myalgias.  Skin: Negative for rash.  Neurological: Negative for weakness and headaches.  Hematological: Negative for adenopathy.     Physical Exam Triage Vital Signs ED Triage Vitals  Enc Vitals Group     BP 11/08/20 1240 139/78     Pulse Rate 11/08/20 1240 (!) 101     Resp 11/08/20 1240 18     Temp 11/08/20 1240 100 F (37.8 C)     Temp Source 11/08/20 1240 Oral     SpO2 11/08/20 1240 95 %     Weight 11/08/20 1241 175 lb (79.4 kg)     Height 11/08/20 1241 5' (1.524 m)     Head Circumference --      Peak Flow --      Pain  Score 11/08/20 1240 0     Pain Loc --      Pain Edu? --      Excl. in [place]? --    No data found.  Updated Vital Signs BP 139/78 (BP Location: Left Arm)   Pulse (!) 101   Temp 100 F (37.8 C) (Oral)   Resp 18   Ht 5' (1.524 m)   Wt 175 lb (79.4 kg)   SpO2 95%   BMI 34.18 kg/m       Physical Exam Vitals and nursing note reviewed.  Constitutional:      General: She is not in acute distress.    Appearance: Normal appearance. She is ill-appearing (She has had multiple episodes of  vomiting in the exam room). She is not toxic-appearing or diaphoretic.  HENT:     Head: Normocephalic and atraumatic.     Nose: Rhinorrhea (mild clear drainage) present.     Mouth/Throat:     Mouth: Mucous membranes are moist.     Pharynx: Oropharynx is clear.  Eyes:     General: No scleral icterus.       Right eye: No discharge.        Left eye: No discharge.     Conjunctiva/sclera: Conjunctivae normal.  Cardiovascular:     Rate and Rhythm: Regular rhythm. Tachycardia present.     Heart sounds: Normal heart sounds.  Pulmonary:     Effort: Pulmonary effort is normal. No respiratory distress.     Breath sounds: Normal breath sounds. No wheezing, rhonchi or rales.  Abdominal:     General: Bowel sounds are normal.     Palpations: Abdomen is soft.     Tenderness: There is abdominal tenderness (mild diffuse TTP).  Musculoskeletal:     Cervical back: Neck supple.  Skin:    General: Skin is dry.  Neurological:     General: No focal deficit present.     Mental Status: She is alert. Mental status is at baseline.     Motor: No weakness.     Gait: Gait normal.  Psychiatric:        Mood and Affect: Mood normal.        Behavior: Behavior normal.        Thought Content: Thought content normal.      UC Treatments / Results  Labs (all labs ordered are listed, but only abnormal results are displayed) Labs Reviewed  RESP PANEL BY RT-PCR (FLU A&B, COVID) ARPGX2 - Abnormal; Notable for the following  components:      Result Value   SARS Coronavirus 2 by RT PCR POSITIVE (*)    All other components within normal limits    EKG   Radiology DG Chest 2 View  Result Date: 11/08/2020 CLINICAL DATA:  Cough and fever for a week. EXAM: CHEST - 2 VIEW COMPARISON:  None. FINDINGS: Bilateral patchy pulmonary infiltrates. No pneumothorax. The heart, hila, mediastinum, lungs, and pleura are otherwise normal. IMPRESSION: Bilateral pulmonary infiltrates are most consistent with pneumonia. The findings are nonspecific but atypical infections such as COVID-19 should be considered. Electronically Signed   By: Dorise Bullion III M.D   On: 11/08/2020 13:03    Procedures Procedures (including critical care time)  Medications Ordered in UC Medications  ondansetron (ZOFRAN-ODT) disintegrating tablet 8 mg (8 mg Oral Given 11/08/20 1249)    Initial Impression / Assessment and Plan / UC Course  I have reviewed the triage vital signs and the nursing notes.  Pertinent labs & imaging results that were available during my care of the patient were reviewed by me and considered in my medical decision making (see chart for details).   70 year old female presenting for 1 week history of fatigue, chills, low-grade fevers, cough, congestion, abdominal cramping, nausea, and diarrhea.  In the clinic vital signs are all stable.  Temperature is 100 degrees.  Pulse slightly elevated at 101 bpm.  Oxygen saturation is 95%.  Blood pressure normal.  She does appear acutely ill, but not toxic.  She has multiple vomiting episodes upon arrival.  Patient given 8 mg ODT Zofran.  Respiratory panel obtained today with positive result  Chest x-ray obtained today which reveals bilateral pulmonary infiltrates consistent with pneumonia. I independently reviewed  imaging. Radiologist states findings nonspecific but does appear to be consistent with COVID-19.  Discussed respiratory panel and chest x-ray results with patient.  Patient  experienced relief of nausea/vomting with the ODT Zofran.  Advised patient of supportive care with increasing rest and fluids.  Sent Zofran to the pharmacy for nausea and vomiting.  Sent prescription cough medication after reviewing controlled substance database and finding patient to be low risk for abuse. Advised inhaler in case she needs that, but she denies any chest pain or breathing difficulty/wheezing. She declines the inhaler.  I have reviewed ED precautions with patient including if she develops fever greater than 100.5 degrees, increased fatigue or weakness, intractable nausea or vomiting, worsening abdominal pain, any chest pain or breathing difficulty or if she is not feeling better in the next 3 to 4 days.  We will send patient's information to the COVID-19 clinic to see if she is a candidate for any outpatient therapies.  Advised her to follow-up with our clinic as needed.   Final Clinical Impressions(s) / UC Diagnoses   Final diagnoses:  Pneumonia due to COVID-19 virus  Nausea vomiting and diarrhea  Cough  Fatigue, unspecified type     Discharge Instructions     Your test results indicate that you have COVID-19 pneumonia.  Antibiotics will not help and it is just supportive care.  I have sent a cough medicine for you to take.  Make sure you are drinking plenty of fluids and resting as well.  I have also sent Zofran, which is the medication you took today for nausea and vomiting. Take Tylenol as needed for discomfort. Gargle warm salt water. Throat lozenges. Humidifier in bedroom to ease coughing. Warm showers. Also review the COVID handout for more information.  Go to ED for any severe acute worsening symptoms, especially if you develop a fever greater than 100.5, increased fatigue or weakness, intractable nausea/vomiting, chest pain or breathing difficulty.  I will refer you to the Covid clinic so someone should call you in the next couple of days to see if you are a candidate for  outpatient antiviral antibody therapy.  COVID-19 INFECTION: The incubation period of COVID-19 is approximately 14 days after exposure, with most symptoms developing in roughly 4-5 days. Symptoms may range in severity from mild to critically severe. Roughly 80% of those infected will have mild symptoms. People of any age may become infected with COVID-19 and have the ability to transmit the virus. The most common symptoms include: fever, fatigue, cough, body aches, headaches, sore throat, nasal congestion, shortness of breath, nausea, vomiting, diarrhea, changes in smell and/or taste.    COURSE OF ILLNESS Some patients may begin with mild disease which can progress quickly into critical symptoms. If your symptoms are worsening please call ahead to the Emergency Department and proceed there for further treatment. Recovery time appears to be roughly 1-2 weeks for mild symptoms and 3-6 weeks for severe disease.   GO IMMEDIATELY TO ER FOR FEVER YOU ARE UNABLE TO GET DOWN WITH TYLENOL, BREATHING PROBLEMS, CHEST PAIN, FATIGUE, LETHARGY, INABILITY TO EAT OR DRINK, ETC  QUARANTINE AND ISOLATION: To help decrease the spread of COVID-19 please remain isolated if you have COVID infection or are highly suspected to have COVID infection. This means -stay home and isolate to one room in the home if you live with others. Do not share a bed or bathroom with others while ill, sanitize and wipe down all countertops and keep common areas clean and disinfected. Stay  home for 5 days. If you have no symptoms or your symptoms are resolving after 5 days, you can leave your house. Continue to wear a mask around others for 5 additional days. If you have been in close contact (within 6 feet) of someone diagnosed with COVID 19, you are advised to quarantine in your home for 14 days as symptoms can develop anywhere from 2-14 days after exposure to the virus. If you develop symptoms, you  must isolate.  Most current guidelines for  COVID after exposure -unvaccinated: isolate 5 days and strict mask use x 5 days. Test on day 5 is possible -vaccinated: wear mask x 10 days if symptoms do not develop -You do not necessarily need to be tested for COVID if you have + exposure and  develop symptoms. Just isolate at home x10 days from symptom onset During this global pandemic, CDC advises to practice social distancing, try to stay at least 61ft away from others at all times. Wear a face covering. Wash and sanitize your hands regularly and avoid going anywhere that is not necessary.  KEEP IN MIND THAT THE COVID TEST IS NOT 100% ACCURATE AND YOU SHOULD STILL DO EVERYTHING TO PREVENT POTENTIAL SPREAD OF VIRUS TO OTHERS (WEAR MASK, WEAR GLOVES, [place] HANDS AND SANITIZE REGULARLY). IF INITIAL TEST IS NEGATIVE, THIS MAY NOT MEAN YOU ARE DEFINITELY NEGATIVE. MOST ACCURATE TESTING IS DONE 5-7 DAYS AFTER EXPOSURE.   It is not advised by CDC to get re-tested after receiving a positive COVID test since you can still test positive for weeks to months after you have already cleared the virus.   *If you have not been vaccinated for COVID, I strongly suggest you consider getting vaccinated as long as there are no contraindications.      ED Prescriptions    Medication Sig Dispense Auth. Provider   ondansetron (ZOFRAN ODT) 8 MG disintegrating tablet Take 1 tablet (8 mg total) by mouth every 8 (eight) hours as needed for up to 7 days for nausea or vomiting. 30 tablet Laurene Footman B, PA-C   guaiFENesin-codeine (CHERATUSSIN AC) 100-10 MG/5ML syrup Take 10 mLs by mouth 4 (four) times daily as needed for up to 7 days for cough. 150 mL Danton Clap, PA-C     PDMP not reviewed this encounter.   Danton Clap, PA-C 11/08/20 1413

## 2020-11-08 NOTE — Discharge Instructions (Addendum)
Your test results indicate that you have COVID-19 pneumonia.  Antibiotics will not help and it is just supportive care.  I have sent a cough medicine for you to take.  Make sure you are drinking plenty of fluids and resting as well.  I have also sent Zofran, which is the medication you took today for nausea and vomiting. Take Tylenol as needed for discomfort. Gargle warm salt water. Throat lozenges. Humidifier in bedroom to ease coughing. Warm showers. Also review the COVID handout for more information.  Go to ED for any severe acute worsening symptoms, especially if you develop a fever greater than 100.5, increased fatigue or weakness, intractable nausea/vomiting, chest pain or breathing difficulty.  I will refer you to the Covid clinic so someone should call you in the next couple of days to see if you are a candidate for outpatient antiviral antibody therapy.  COVID-19 INFECTION: The incubation period of COVID-19 is approximately 14 days after exposure, with most symptoms developing in roughly 4-5 days. Symptoms may range in severity from mild to critically severe. Roughly 80% of those infected will have mild symptoms. People of any age may become infected with COVID-19 and have the ability to transmit the virus. The most common symptoms include: fever, fatigue, cough, body aches, headaches, sore throat, nasal congestion, shortness of breath, nausea, vomiting, diarrhea, changes in smell and/or taste.    COURSE OF ILLNESS Some patients may begin with mild disease which can progress quickly into critical symptoms. If your symptoms are worsening please call ahead to the Emergency Department and proceed there for further treatment. Recovery time appears to be roughly 1-2 weeks for mild symptoms and 3-6 weeks for severe disease.   GO IMMEDIATELY TO ER FOR FEVER YOU ARE UNABLE TO GET DOWN WITH TYLENOL, BREATHING PROBLEMS, CHEST PAIN, FATIGUE, LETHARGY, INABILITY TO EAT OR DRINK, ETC  QUARANTINE AND ISOLATION:  To help decrease the spread of COVID-19 please remain isolated if you have COVID infection or are highly suspected to have COVID infection. This means -stay home and isolate to one room in the home if you live with others. Do not share a bed or bathroom with others while ill, sanitize and wipe down all countertops and keep common areas clean and disinfected. Stay home for 5 days. If you have no symptoms or your symptoms are resolving after 5 days, you can leave your house. Continue to wear a mask around others for 5 additional days. If you have been in close contact (within 6 feet) of someone diagnosed with COVID 19, you are advised to quarantine in your home for 14 days as symptoms can develop anywhere from 2-14 days after exposure to the virus. If you develop symptoms, you  must isolate.  Most current guidelines for COVID after exposure -unvaccinated: isolate 5 days and strict mask use x 5 days. Test on day 5 is possible -vaccinated: wear mask x 10 days if symptoms do not develop -You do not necessarily need to be tested for COVID if you have + exposure and  develop symptoms. Just isolate at home x10 days from symptom onset During this global pandemic, CDC advises to practice social distancing, try to stay at least 61ft away from others at all times. Wear a face covering. Wash and sanitize your hands regularly and avoid going anywhere that is not necessary.  KEEP IN MIND THAT THE COVID TEST IS NOT 100% ACCURATE AND YOU SHOULD STILL DO EVERYTHING TO PREVENT POTENTIAL SPREAD OF VIRUS TO OTHERS (WEAR  MASK, WEAR GLOVES, [place] HANDS AND SANITIZE REGULARLY). IF INITIAL TEST IS NEGATIVE, THIS MAY NOT MEAN YOU ARE DEFINITELY NEGATIVE. MOST ACCURATE TESTING IS DONE 5-7 DAYS AFTER EXPOSURE.   It is not advised by CDC to get re-tested after receiving a positive COVID test since you can still test positive for weeks to months after you have already cleared the virus.   *If you have not been vaccinated for COVID, I  strongly suggest you consider getting vaccinated as long as there are no contraindications.

## 2020-11-08 NOTE — ED Triage Notes (Signed)
Patient in today c/o cough, nausea, diarrhea, chills, fever (100.3) x 1 week. Patient has taken OTC Nyquil. Patient has had the covid vaccines, no booster.

## 2020-11-09 ENCOUNTER — Telehealth: Payer: Self-pay | Admitting: Unknown Physician Specialty

## 2020-11-09 NOTE — Telephone Encounter (Signed)
Called to discuss with patient about COVID-19 symptoms and the use of one of the available treatments for those with mild to moderate Covid symptoms and at a high risk of hospitalization.  Pt appears to qualify for outpatient treatment due to co-morbid conditions and/or a member of an at-risk group in accordance with the FDA Emergency Use Authorization.    Symptom onset: 3/11 Vaccinated: yes Booster? no  Out of range for mab benefit as will be day 11 tomorrow at next infusion.    Holly Mclaughlin

## 2021-04-07 ENCOUNTER — Other Ambulatory Visit: Payer: Self-pay | Admitting: Family Medicine

## 2021-04-07 DIAGNOSIS — Z780 Asymptomatic menopausal state: Secondary | ICD-10-CM

## 2021-07-28 ENCOUNTER — Other Ambulatory Visit: Payer: Self-pay | Admitting: Family Medicine

## 2021-07-28 DIAGNOSIS — Z1231 Encounter for screening mammogram for malignant neoplasm of breast: Secondary | ICD-10-CM

## 2021-09-02 ENCOUNTER — Other Ambulatory Visit: Payer: Self-pay

## 2021-09-02 ENCOUNTER — Ambulatory Visit
Admission: RE | Admit: 2021-09-02 | Discharge: 2021-09-02 | Disposition: A | Discharge status: Home / Self Care | Payer: Medicare Other | Source: Ambulatory Visit | Attending: Family Medicine | Admitting: Family Medicine

## 2021-09-02 DIAGNOSIS — Z1231 Encounter for screening mammogram for malignant neoplasm of breast: Secondary | ICD-10-CM | POA: Insufficient documentation

## 2021-09-04 ENCOUNTER — Encounter: Payer: Self-pay | Admitting: *Deleted

## 2021-09-07 ENCOUNTER — Ambulatory Visit: Payer: Medicare Other | Admitting: Anesthesiology

## 2021-09-07 ENCOUNTER — Encounter: Payer: Self-pay | Admitting: *Deleted

## 2021-09-07 ENCOUNTER — Encounter
Admission: RE | Disposition: A | Discharge status: Home / Self Care | Payer: Self-pay | Source: Home / Self Care | Attending: Gastroenterology

## 2021-09-07 ENCOUNTER — Ambulatory Visit
Admission: RE | Admit: 2021-09-07 | Discharge: 2021-09-07 | Disposition: A | Discharge status: Home / Self Care | Payer: Medicare Other | Attending: Gastroenterology | Admitting: Gastroenterology

## 2021-09-07 DIAGNOSIS — E7800 Pure hypercholesterolemia, unspecified: Secondary | ICD-10-CM | POA: Insufficient documentation

## 2021-09-07 DIAGNOSIS — K640 First degree hemorrhoids: Secondary | ICD-10-CM | POA: Insufficient documentation

## 2021-09-07 DIAGNOSIS — K621 Rectal polyp: Secondary | ICD-10-CM | POA: Insufficient documentation

## 2021-09-07 DIAGNOSIS — K644 Residual hemorrhoidal skin tags: Secondary | ICD-10-CM | POA: Insufficient documentation

## 2021-09-07 DIAGNOSIS — D122 Benign neoplasm of ascending colon: Secondary | ICD-10-CM | POA: Insufficient documentation

## 2021-09-07 DIAGNOSIS — F32A Depression, unspecified: Secondary | ICD-10-CM | POA: Diagnosis not present

## 2021-09-07 DIAGNOSIS — Z8371 Family history of colonic polyps: Secondary | ICD-10-CM | POA: Insufficient documentation

## 2021-09-07 DIAGNOSIS — K581 Irritable bowel syndrome with constipation: Secondary | ICD-10-CM | POA: Insufficient documentation

## 2021-09-07 DIAGNOSIS — K219 Gastro-esophageal reflux disease without esophagitis: Secondary | ICD-10-CM | POA: Diagnosis not present

## 2021-09-07 DIAGNOSIS — Z09 Encounter for follow-up examination after completed treatment for conditions other than malignant neoplasm: Secondary | ICD-10-CM | POA: Diagnosis present

## 2021-09-07 HISTORY — PX: COLONOSCOPY WITH PROPOFOL: SHX5780

## 2021-09-07 HISTORY — DX: Endometrial intraepithelial neoplasia (EIN): N85.02

## 2021-09-07 HISTORY — DX: Other specified disorders of bone density and structure, multiple sites: M85.89

## 2021-09-07 HISTORY — DX: Benign neoplasm of connective and other soft tissue, unspecified: D21.9

## 2021-09-07 HISTORY — DX: Irritable bowel syndrome with constipation: K58.1

## 2021-09-07 HISTORY — DX: Other cervical disc degeneration, unspecified cervical region: M50.30

## 2021-09-07 HISTORY — DX: Pure hypercholesterolemia, unspecified: E78.00

## 2021-09-07 HISTORY — DX: Unspecified osteoarthritis, unspecified site: M19.90

## 2021-09-07 HISTORY — DX: Unspecified hearing loss, unspecified ear: H91.90

## 2021-09-07 SURGERY — COLONOSCOPY WITH PROPOFOL
Anesthesia: General

## 2021-09-07 MED ORDER — PROPOFOL 10 MG/ML IV BOLUS
INTRAVENOUS | Status: DC | PRN
  Administered 2021-09-07: 70 mg via INTRAVENOUS

## 2021-09-07 MED ORDER — PROPOFOL 500 MG/50ML IV EMUL
INTRAVENOUS | Status: DC | PRN
  Administered 2021-09-07: 140 ug/kg/min via INTRAVENOUS

## 2021-09-07 MED ORDER — PROPOFOL 500 MG/50ML IV EMUL
INTRAVENOUS | Status: AC
  Filled 2021-09-07: qty 100

## 2021-09-07 MED ORDER — PROPOFOL 500 MG/50ML IV EMUL
INTRAVENOUS | Status: AC
  Filled 2021-09-07: qty 50

## 2021-09-07 MED ORDER — SODIUM CHLORIDE 0.9 % IV SOLN: INTRAVENOUS | Status: DC

## 2021-09-07 NOTE — Transfer of Care (Signed)
Immediate Anesthesia Transfer of Care Note  Patient: Holly Mclaughlin  Procedure(s) Performed: COLONOSCOPY WITH PROPOFOL  Patient Location: Endoscopy Unit  Anesthesia Type:General  Level of Consciousness: sedated  Airway & Oxygen Therapy: Patient Spontanous Breathing  Post-op Assessment: Report given to RN and Post -op Vital signs reviewed and stable  Post vital signs: Reviewed and stable  Last Vitals:  Vitals Value Taken Time  BP 113/65 09/07/21 0917  Temp    Pulse 71 09/07/21 0917  Resp 15 09/07/21 0917  SpO2 97 % 09/07/21 0917    Last Pain:  Vitals:   09/07/21 0917  TempSrc:   PainSc: Asleep         Complications: No notable events documented.

## 2021-09-07 NOTE — H&P (Signed)
Pre-Procedure H&P   Patient ID: Holly Mclaughlin is a 70 y.o. female.  Gastroenterology Provider: Annamaria Helling, DO  Referring Provider: Dr. Hoy Morn PCP: Hortencia Pilar, MD  Date: 09/07/2021  HPI Holly Mclaughlin is a 70 y.o. female who presents today for Colonoscopy for personal h/o polyps; fhx polyps (mother). Takes ibuprofen several times a week. H/o hysterectomy Deals with constipation, but treated with stool softeners to good effect. No blood/melena/diarrhea/abdominal pain.  No other acute gi complaints.  2000 colonoscopy- TA 08/2015- Dr. Candace Cruise- no polyps   Past Medical History:  Diagnosis Date   Arthritis    Atypical endometrial hyperplasia    Collagen vascular disease ([place])    RA   Depression    Disc disease, degenerative, cervical    Fibroid    GERD (gastroesophageal reflux disease)    Hearing loss    Hyperlipidemia    Irritable bowel syndrome with constipation    Osteopenia of multiple sites    Pure hypercholesterolemia     Past Surgical History:  Procedure Laterality Date   ABDOMINAL HYSTERECTOMY     COLONOSCOPY     DILATATION & CURETTAGE/HYSTEROSCOPY WITH MYOSURE N/A 08/11/2018   Procedure: Castine;  Surgeon: Schermerhorn, Gwen Her, MD;  Location: [facility] ORS;  Service: Gynecology;  Laterality: N/A;   ESOPHAGOGASTRODUODENOSCOPY     JOINT REPLACEMENT Left 2014   knee   REPLACEMENT TOTAL KNEE Left 2014   SALPINGOOPHORECTOMY Bilateral 09/22/2018   Procedure: SALPINGO OOPHORECTOMY;  Surgeon: Schermerhorn, Gwen Her, MD;  Location: [facility] ORS;  Service: Gynecology;  Laterality: Bilateral;   TONSILLECTOMY     TUBAL LIGATION     VAGINAL HYSTERECTOMY N/A 09/22/2018   Procedure: HYSTERECTOMY VAGINAL;  Surgeon: Schermerhorn, Gwen Her, MD;  Location: [facility] OR[facility];  Service: Gynecology;  Laterality: N/A;    Family History Mother- h/o colon polyps No h/o GI disease or malignancy  Review of Systems   Constitutional:  Negative for activity change, appetite change, chills, diaphoresis, fatigue, fever and unexpected weight change.  HENT:  Negative for trouble swallowing and voice change.   Respiratory:  Negative for shortness of breath and wheezing.   Cardiovascular:  Negative for chest pain, palpitations and leg swelling.  Gastrointestinal:  Positive for constipation. Negative for abdominal distention, abdominal pain, anal bleeding, blood in stool, diarrhea, nausea, rectal pain and vomiting.  Musculoskeletal:  Negative for arthralgias and myalgias.  Skin:  Negative for color change and pallor.  Neurological:  Negative for dizziness, syncope and weakness.  Psychiatric/Behavioral:  Negative for confusion.   All other systems reviewed and are negative.   Medications No current facility-administered medications on file prior to encounter.   Current Outpatient Medications on File Prior to Encounter  Medication Sig Dispense Refill   atorvastatin (LIPITOR) 20 MG tablet Take 20 mg by mouth daily.      buPROPion (WELLBUTRIN XL) 150 MG 24 hr tablet Take 150 mg by mouth daily.  3   citalopram (CELEXA) 20 MG tablet Take 20 mg by mouth daily.     Docusate Sodium (COLACE PO) Take 100 mg by mouth as needed.     acetaminophen (TYLENOL) 500 MG tablet Take 500 mg by mouth 2 (two) times daily.     cimetidine (TAGAMET) 200 MG tablet Take 200 mg by mouth daily as needed (heartburn).      cyclobenzaprine (FLEXERIL) 5 MG tablet Take 5 mg by mouth 3 (three) times daily as needed for muscle spasms.     ibuprofen (  ADVIL,MOTRIN) 600 MG tablet Take 1 tablet (600 mg total) by mouth every 8 (eight) hours as needed for moderate pain. 50 tablet 1   ondansetron (ZOFRAN) 4 MG tablet Take 1 tablet (4 mg total) by mouth every 6 (six) hours as needed for nausea. 20 tablet 0   simethicone (MYLICON) 086 MG chewable tablet Chew 125 mg by mouth every 6 (six) hours as needed for flatulence.      Pertinent medications related  to GI and procedure were reviewed by me with the patient prior to the procedure   Current Facility-Administered Medications:    0.9 %  sodium chloride infusion, , Intravenous, Continuous, Annamaria Helling, DO, Last Rate: 20 mL/hr at 09/07/21 0834, Continued from Pre-op at 09/07/21 0834  sodium chloride 20 mL/hr at 09/07/21 7619       Allergies  Allergen Reactions   Lorazepam Itching   Allergies were reviewed by me prior to the procedure  Objective    Vitals:   09/07/21 0729  BP: (!) 162/64  Pulse: 82  Resp: 18  Temp: (!) 96.7 F (35.9 C)  TempSrc: Temporal  SpO2: 99%  Weight: 81.6 kg  Height: 4\' 10"  (1.473 m)     Physical Exam Vitals and nursing note reviewed.  Constitutional:      General: She is not in acute distress.    Appearance: Normal appearance. She is obese. She is not ill-appearing, toxic-appearing or diaphoretic.  HENT:     Head: Normocephalic and atraumatic.     Nose: Nose normal.     Mouth/Throat:     Mouth: Mucous membranes are moist.     Pharynx: Oropharynx is clear.  Eyes:     General: No scleral icterus.    Extraocular Movements: Extraocular movements intact.  Cardiovascular:     Rate and Rhythm: Normal rate and regular rhythm.     Heart sounds: Normal heart sounds. No murmur heard.   No friction rub. No gallop.  Pulmonary:     Effort: Pulmonary effort is normal. No respiratory distress.     Breath sounds: Normal breath sounds. No wheezing, rhonchi or rales.  Abdominal:     General: Bowel sounds are normal. There is no distension.     Palpations: Abdomen is soft.     Tenderness: There is no abdominal tenderness. There is no guarding or rebound.  Musculoskeletal:     Cervical back: Neck supple.     Right lower leg: No edema.     Left lower leg: No edema.  Skin:    General: Skin is warm and dry.     Coloration: Skin is not jaundiced or pale.  Neurological:     General: No focal deficit present.     Mental Status: She is alert and  oriented to person, place, and time. Mental status is at baseline.  Psychiatric:        Mood and Affect: Mood normal.        Behavior: Behavior normal.        Thought Content: Thought content normal.        Judgment: Judgment normal.     Assessment:  Holly Mclaughlin is a 70 y.o. female  who presents today for Colonoscopy for surveillance personal h/o colon polyps.  Plan:  Colonoscopy with possible intervention today  Colonoscopy with possible biopsy, control of bleeding, polypectomy, and interventions as necessary has been discussed with the patient/patient representative. Informed consent was obtained from the patient/patient representative after explaining the indication, nature,  and risks of the procedure including but not limited to death, bleeding, perforation, missed neoplasm/lesions, cardiorespiratory compromise, and reaction to medications. Opportunity for questions was given and appropriate answers were provided. Patient/patient representative has verbalized understanding is amenable to undergoing the procedure.   Annamaria Helling, DO  [facility]  Portions of the record may have been created with voice recognition software. Occasional wrong-word or 'sound-a-like' substitutions may have occurred due to the inherent limitations of voice recognition software.  Read the chart carefully and recognize, using context, where substitutions may have occurred.

## 2021-09-07 NOTE — Op Note (Signed)
[facility] Gastroenterology Patient Name: Holly Mclaughlin Procedure Date: 09/07/2021 8:28 AM MRN: [id number] Account #: [id number] Date of Birth: Apr 25, 1951 Admit Type: Outpatient Age: 70 Room: [facility] ENDO ROOM 2 Gender: Female Note Status: Finalized Instrument Name: Colonscope [id number] Procedure:             Colonoscopy Indications:           High risk colon cancer surveillance: Personal history                         of colonic polyps Providers:             Rueben Bash, DO Referring MD:          Kerin Perna MD, MD (Referring MD) Medicines:             Monitored Anesthesia Care Complications:         No immediate complications. Estimated blood loss:                         Minimal. Procedure:             Pre-Anesthesia Assessment:                        - Prior to the procedure, a History and Physical was                         performed, and patient medications and allergies were                         reviewed. The patient is competent. The risks and                         benefits of the procedure and the sedation options and                         risks were discussed with the patient. All questions                         were answered and informed consent was obtained.                         Patient identification and proposed procedure were                         verified by the physician, the nurse, the anesthetist                         and the technician in the endoscopy suite. Mental                         Status Examination: alert and oriented. Airway                         Examination: normal oropharyngeal airway and neck                         mobility. Respiratory Examination: clear to  auscultation. CV Examination: RRR, no murmurs, no S3                         or S4. Prophylactic Antibiotics: The patient does not                         require prophylactic antibiotics. Prior                          Anticoagulants: The patient has taken no previous                         anticoagulant or antiplatelet agents. ASA Grade                         Assessment: II - A patient with mild systemic disease.                         After reviewing the risks and benefits, the patient                         was deemed in satisfactory condition to undergo the                         procedure. The anesthesia plan was to use monitored                         anesthesia care (MAC). Immediately prior to                         administration of medications, the patient was                         re-assessed for adequacy to receive sedatives. The                         heart rate, respiratory rate, oxygen saturations,                         blood pressure, adequacy of pulmonary ventilation, and                         response to care were monitored throughout the                         procedure. The physical status of the patient was                         re-assessed after the procedure.                        After obtaining informed consent, the colonoscope was                         passed under direct vision. Throughout the procedure,                         the patient's blood pressure, pulse, and oxygen  saturations were monitored continuously. The                         Colonoscope was introduced through the anus and                         advanced to the the terminal ileum, with                         identification of the appendiceal orifice and IC                         valve. The colonoscopy was performed without                         difficulty. The patient tolerated the procedure well.                         The quality of the bowel preparation was evaluated                         using the BBPS [facility] Bowel Preparation Scale) with                         scores of: Right Colon = 3, Transverse Colon = 3 and                         Left Colon = 3  (entire mucosa seen well with no                         residual staining, small fragments of stool or opaque                         liquid). The total BBPS score equals 9. The terminal                         ileum, ileocecal valve, appendiceal orifice, and                         rectum were photographed. Findings:      Skin tags were found on perianal exam.      The digital rectal exam was normal. Pertinent negatives include normal       sphincter tone.      The terminal ileum appeared normal. Estimated blood loss: none.      Non-bleeding internal hemorrhoids were found during retroflexion. The       hemorrhoids were Grade I (internal hemorrhoids that do not prolapse).      Two sessile polyps were found in the rectum and ascending colon. The       polyps were 1 to 3 mm in size. These polyps were removed with a cold       biopsy forceps. Resection and retrieval were complete. Estimated blood       loss was minimal.      The exam was otherwise without abnormality on direct and retroflexion       views. Impression:            - Perianal skin tags found on perianal exam.                        -  The examined portion of the ileum was normal.                        - Non-bleeding internal hemorrhoids.                        - Two 1 to 3 mm polyps in the rectum and in the                         ascending colon, removed with a cold biopsy forceps.                         Resected and retrieved.                        - The examination was otherwise normal on direct and                         retroflexion views. Recommendation:        - Discharge patient to home.                        - Resume previous diet.                        - Continue present medications.                        - Await pathology results.                        - Repeat colonoscopy for surveillance based on                         pathology results.                        - Return to referring  physician. Procedure Code(s):     --- Professional ---                        [phone], Colonoscopy, flexible; with biopsy, single or                         multiple Diagnosis Code(s):     --- Professional ---                        Z86.010, Personal history of colonic polyps                        K64.0, First degree hemorrhoids                        K62.1, Rectal polyp                        K63.5, Polyp of colon                        K64.4, Residual hemorrhoidal skin tags CPT copyright 2019 American Medical Association. All rights reserved. The codes documented in this report are preliminary and upon coder review may  be revised to meet current compliance requirements. Attending Participation:      I personally performed the entire procedure. Volney American, DO Annamaria Helling DO, DO 09/07/2021 9:17:35 AM This report has been signed electronically. Number of Addenda: 0 Note Initiated On: 09/07/2021 8:28 AM Scope Withdrawal Time: 0 hours 13 minutes 24 seconds  Total Procedure Duration: 0 hours 21 minutes 53 seconds  Estimated Blood Loss:  Estimated blood loss was minimal.      [facility]

## 2021-09-07 NOTE — Anesthesia Preprocedure Evaluation (Signed)
Anesthesia Evaluation  Patient identified by MRN, date of birth, ID band Patient awake    Reviewed: Allergy & Precautions, NPO status , Patient's Chart, lab work & pertinent test results  Airway Mallampati: II  TM Distance: >3 FB Neck ROM: Full    Dental no notable dental hx.    Pulmonary neg pulmonary ROS,    Pulmonary exam normal        Cardiovascular negative cardio ROS Normal cardiovascular exam     Neuro/Psych PSYCHIATRIC DISORDERS Depression negative neurological ROS     GI/Hepatic Neg liver ROS, Bowel prep,GERD  Medicated,  Endo/Other  negative endocrine ROS  Renal/GU negative Renal ROS  negative genitourinary   Musculoskeletal  (+) Arthritis ,   Abdominal   Peds negative pediatric ROS (+)  Hematology negative hematology ROS (+)   Anesthesia Other Findings  Hearing loss, central   Pure hypercholesterolemia   Arthritis   Depression   Irritable bowel syndrome with constipation   Disc disease, degenerative, cervical   Fibroid   Atypical endometrial hyperplasia   Osteopenia of multiple sites   H/O: hysterectomy    Reproductive/Obstetrics negative OB ROS                             Anesthesia Physical Anesthesia Plan  ASA: 2  Anesthesia Plan: General   Post-op Pain Management:    Induction: Intravenous  PONV Risk Score and Plan: 2 and TIVA and Propofol infusion  Airway Management Planned: Natural Airway and Nasal Cannula  Additional Equipment:   Intra-op Plan:   Post-operative Plan:   Informed Consent: I have reviewed the patients History and Physical, chart, labs and discussed the procedure including the risks, benefits and alternatives for the proposed anesthesia with the patient or authorized representative who has indicated his/her understanding and acceptance.       Plan Discussed with: CRNA, Anesthesiologist and Surgeon  Anesthesia Plan  Comments:         Anesthesia Quick Evaluation

## 2021-09-07 NOTE — Anesthesia Postprocedure Evaluation (Signed)
Anesthesia Post Note  Patient: Holly Mclaughlin  Procedure(s) Performed: COLONOSCOPY WITH PROPOFOL  Patient location during evaluation: Phase II Anesthesia Type: General Level of consciousness: awake and alert, awake and oriented Pain management: pain level controlled Vital Signs Assessment: post-procedure vital signs reviewed and stable Respiratory status: spontaneous breathing, nonlabored ventilation and respiratory function stable Cardiovascular status: blood pressure returned to baseline and stable Postop Assessment: no apparent nausea or vomiting Anesthetic complications: no   No notable events documented.   Last Vitals:  Vitals:   09/07/21 0938 09/07/21 0947  BP: (!) 132/50 128/80  Pulse: 69 68  Resp: 17 16  Temp:    SpO2: 97% 99%    Last Pain:  Vitals:   09/07/21 0947  TempSrc:   PainSc: 0-No pain                 Phill Mutter

## 2021-09-07 NOTE — Interval H&P Note (Signed)
History and Physical Interval Note: Preprocedure H&P from 09/07/21  was reviewed and there was no interval change after seeing and examining the patient.  Written consent was obtained from the patient after discussion of risks, benefits, and alternatives. Patient has consented to proceed with Colonoscopy with possible intervention   09/07/2021 8:46 AM  Holly Mclaughlin  has presented today for surgery, with the diagnosis of personal history colon polyps.  The various methods of treatment have been discussed with the patient and family. After consideration of risks, benefits and other options for treatment, the patient has consented to  Procedure(s): COLONOSCOPY WITH PROPOFOL (N/A) as a surgical intervention.  The patient's history has been reviewed, patient examined, no change in status, stable for surgery.  I have reviewed the patient's chart and labs.  Questions were answered to the patient's satisfaction.     Annamaria Helling

## 2021-09-08 ENCOUNTER — Encounter: Payer: Self-pay | Admitting: Gastroenterology

## 2021-09-08 LAB — SURGICAL PATHOLOGY

## 2022-02-26 IMAGING — MG MM DIGITAL SCREENING BILAT W/ TOMO AND CAD
8 series · 8 of 24 positions shown · non-contrast
Comparison: Previous exam(s).

CLINICAL DATA: Screening.

EXAM:
DIGITAL SCREENING BILATERAL MAMMOGRAM WITH TOMOSYNTHESIS AND CAD
TECHNIQUE: Bilateral screening digital craniocaudal and mediolateral oblique
mammograms were obtained. Bilateral screening digital breast
tomosynthesis was performed. The images were evaluated with
computer-aided detection.

[R MLO synth-2D]
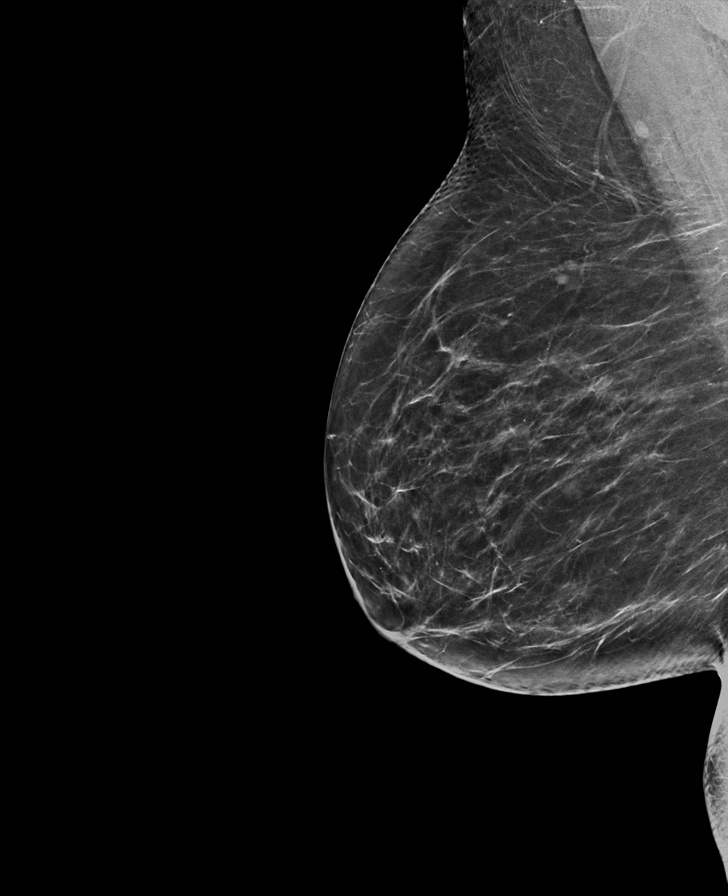

[L MLO synth-2D]
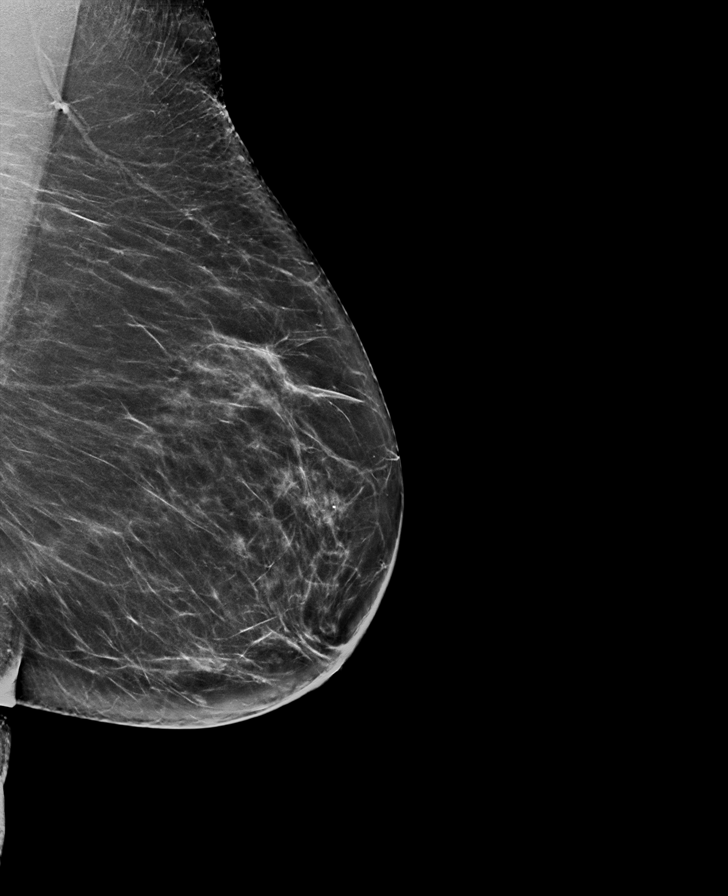

[R CC synth-2D]
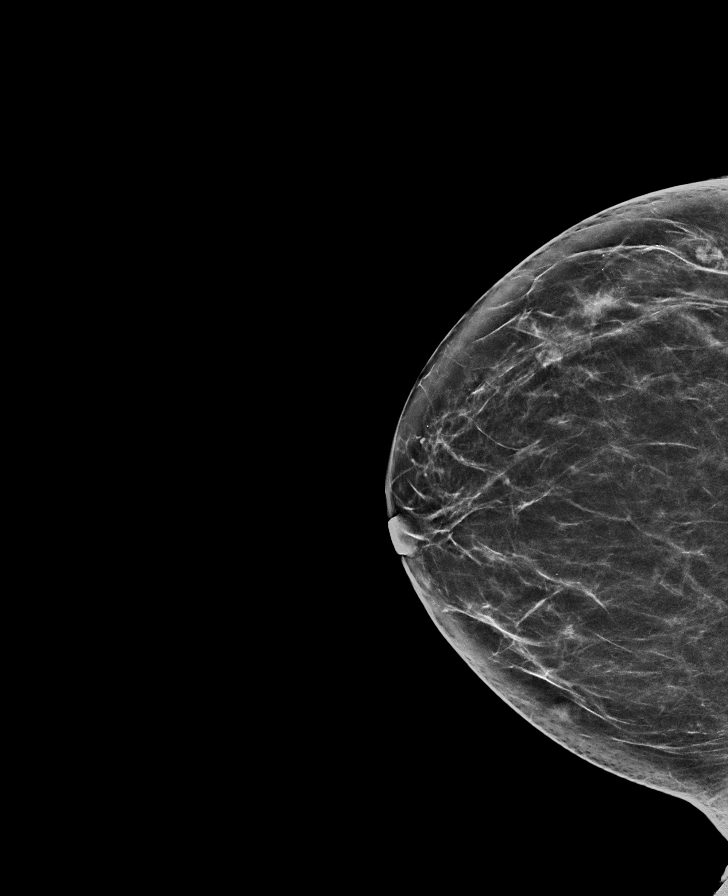

[L CC synth-2D]
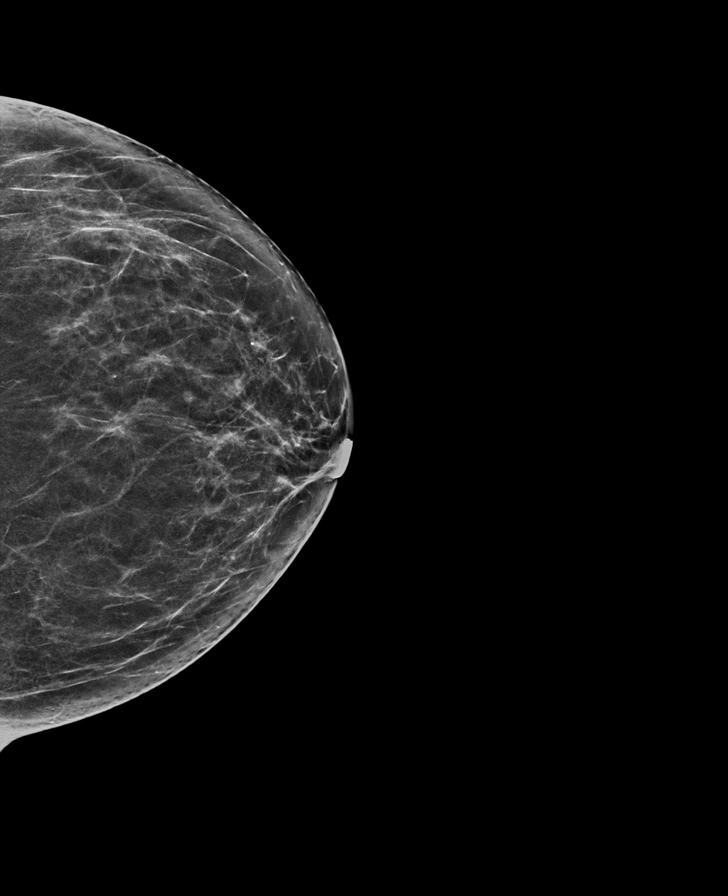

[L MLO tomo · tomo slice 38/75.0]
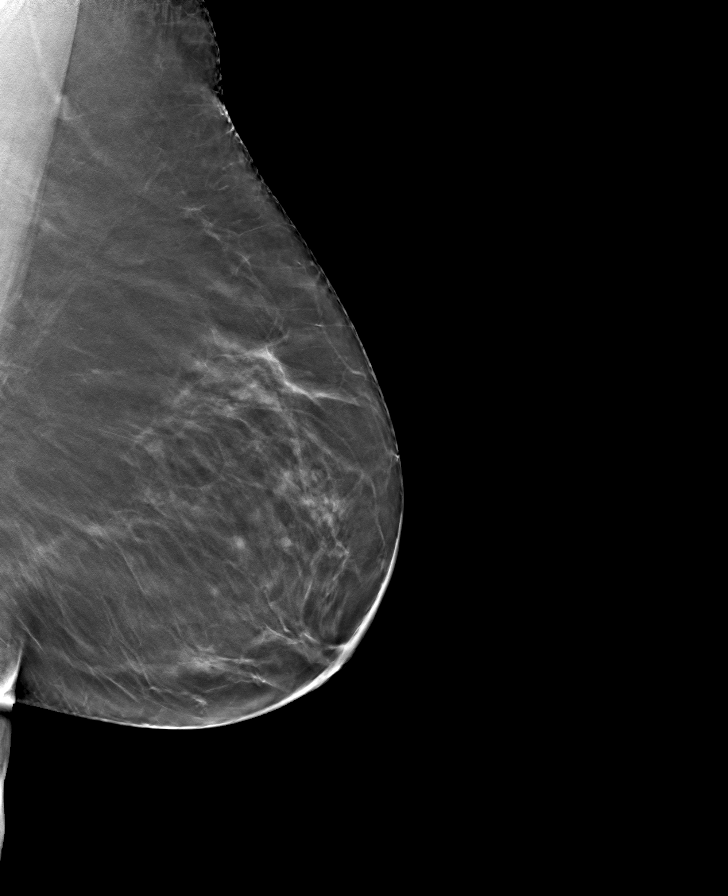

[R CC tomo · tomo slice 34/67.0]
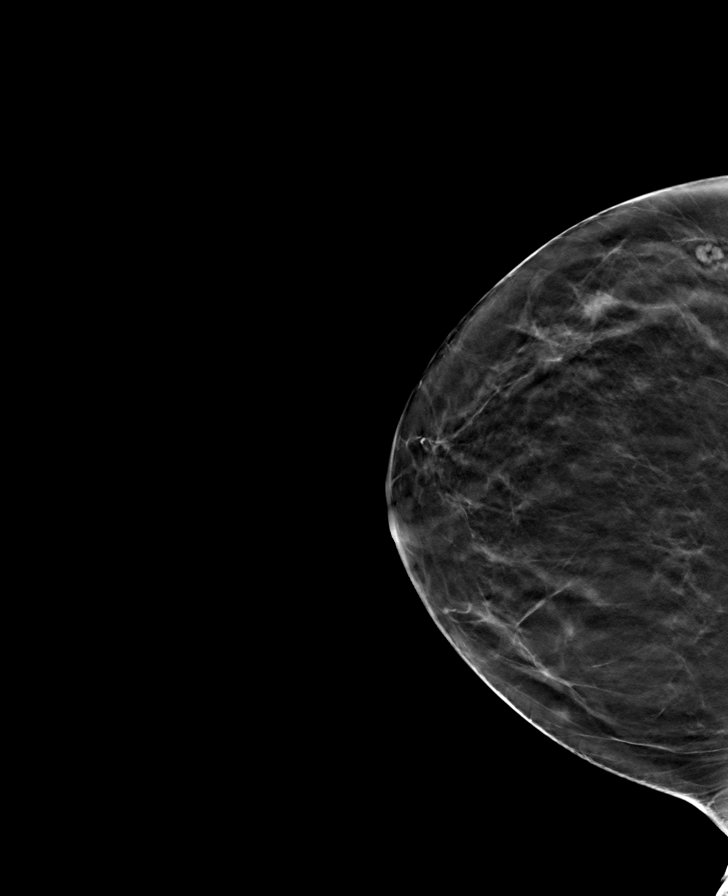

[L CC tomo · tomo slice 33/65.0]
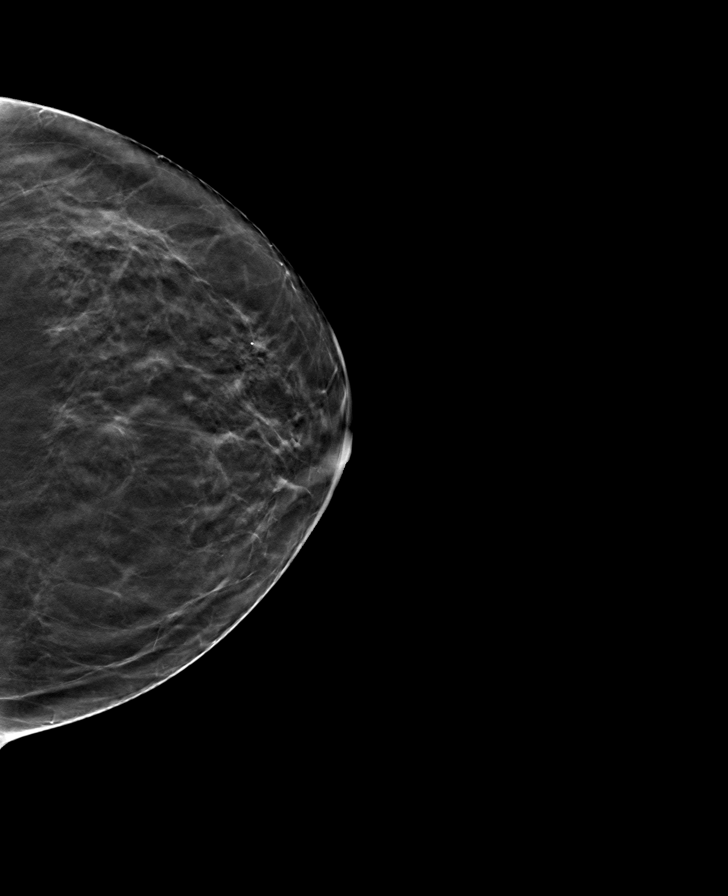

[R MLO tomo · tomo slice 37/72.0]
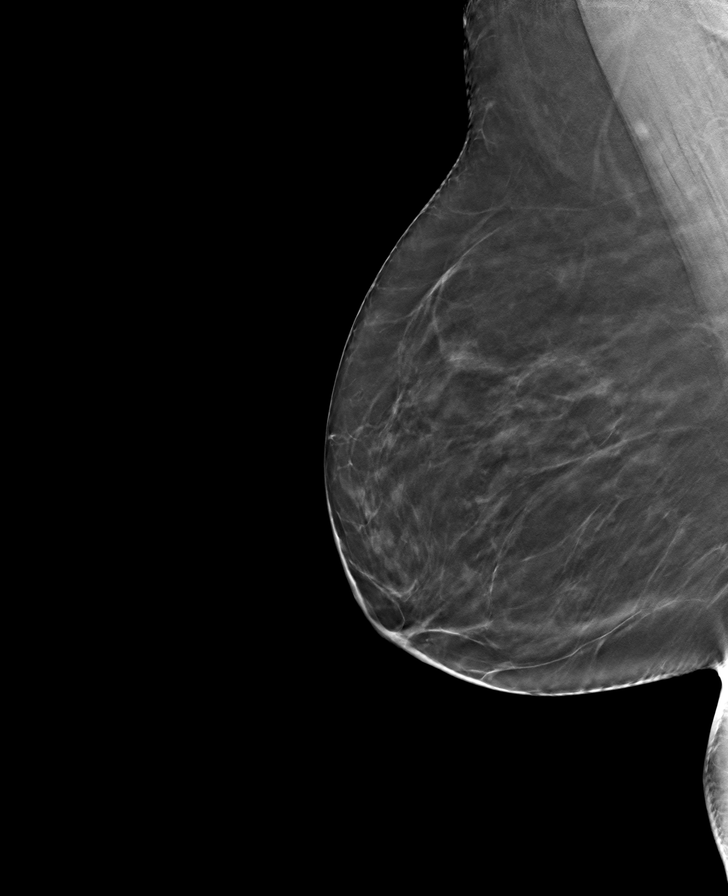

[8 of 24 positions shown; findings below may reference images not displayed]

ACR Breast Density Category b: There are scattered areas of
fibroglandular density.
FINDINGS: There are no findings suspicious for malignancy.
IMPRESSION: No mammographic evidence of malignancy. A result letter of this
screening mammogram will be mailed directly to the patient.

RECOMMENDATION:
Screening mammogram in one year. (Code:51-O-LD2)

BI-RADS CATEGORY  1: Negative.

## 2022-06-07 ENCOUNTER — Other Ambulatory Visit: Payer: Self-pay | Admitting: Family Medicine

## 2022-06-07 DIAGNOSIS — Z780 Asymptomatic menopausal state: Secondary | ICD-10-CM

## 2022-06-28 ENCOUNTER — Ambulatory Visit
Admission: RE | Admit: 2022-06-28 | Discharge: 2022-06-28 | Disposition: A | Discharge status: Home / Self Care | Payer: Medicare Other | Source: Ambulatory Visit | Attending: Family Medicine | Admitting: Family Medicine

## 2022-06-28 DIAGNOSIS — Z780 Asymptomatic menopausal state: Secondary | ICD-10-CM | POA: Insufficient documentation

## 2022-07-27 ENCOUNTER — Other Ambulatory Visit: Payer: Self-pay | Admitting: Family Medicine

## 2022-07-27 DIAGNOSIS — Z1231 Encounter for screening mammogram for malignant neoplasm of breast: Secondary | ICD-10-CM

## 2022-09-06 ENCOUNTER — Ambulatory Visit
Admission: RE | Admit: 2022-09-06 | Discharge: 2022-09-06 | Disposition: A | Discharge status: Home / Self Care | Payer: Medicare Other | Source: Ambulatory Visit | Attending: Family Medicine | Admitting: Family Medicine

## 2022-09-06 DIAGNOSIS — Z1231 Encounter for screening mammogram for malignant neoplasm of breast: Secondary | ICD-10-CM | POA: Insufficient documentation

## 2023-08-04 ENCOUNTER — Other Ambulatory Visit: Payer: Self-pay | Admitting: Family Medicine

## 2023-08-04 DIAGNOSIS — Z1231 Encounter for screening mammogram for malignant neoplasm of breast: Secondary | ICD-10-CM

## 2023-09-08 ENCOUNTER — Inpatient Hospital Stay: Admission: RE | Admit: 2023-09-08 | Payer: Medicare Other | Source: Ambulatory Visit

## 2023-09-28 ENCOUNTER — Ambulatory Visit
Admission: RE | Admit: 2023-09-28 | Discharge: 2023-09-28 | Disposition: A | Discharge status: Home / Self Care | Payer: Medicare Other | Source: Ambulatory Visit | Attending: Family Medicine | Admitting: Family Medicine

## 2023-09-28 DIAGNOSIS — Z1231 Encounter for screening mammogram for malignant neoplasm of breast: Secondary | ICD-10-CM | POA: Insufficient documentation

## 2023-10-28 ENCOUNTER — Ambulatory Visit
Admission: EM | Admit: 2023-10-28 | Discharge: 2023-10-28 | Disposition: A | Discharge status: Home / Self Care | Payer: Medicare Other | Attending: Family Medicine | Admitting: Family Medicine

## 2023-10-28 ENCOUNTER — Encounter: Payer: Self-pay | Admitting: Emergency Medicine

## 2023-10-28 DIAGNOSIS — R112 Nausea with vomiting, unspecified: Secondary | ICD-10-CM | POA: Diagnosis present

## 2023-10-28 DIAGNOSIS — B379 Candidiasis, unspecified: Secondary | ICD-10-CM | POA: Diagnosis not present

## 2023-10-28 DIAGNOSIS — M549 Dorsalgia, unspecified: Secondary | ICD-10-CM | POA: Insufficient documentation

## 2023-10-28 DIAGNOSIS — R1010 Upper abdominal pain, unspecified: Secondary | ICD-10-CM | POA: Diagnosis not present

## 2023-10-28 LAB — CBC WITH DIFFERENTIAL/PLATELET
Abs Immature Granulocytes: 0.02 10*3/uL (ref 0.00–0.07)
Basophils Absolute: 0 10*3/uL (ref 0.0–0.1)
Basophils Relative: 0 %
Eosinophils Absolute: 0 10*3/uL (ref 0.0–0.5)
Eosinophils Relative: 1 %
HCT: 39.5 % (ref 36.0–46.0)
Hemoglobin: 13.9 g/dL (ref 12.0–15.0)
Immature Granulocytes: 0 %
Lymphocytes Relative: 18 %
Lymphs Abs: 1 10*3/uL (ref 0.7–4.0)
MCH: 30.8 pg (ref 26.0–34.0)
MCHC: 35.2 g/dL (ref 30.0–36.0)
MCV: 87.6 fL (ref 80.0–100.0)
Monocytes Absolute: 0.5 10*3/uL (ref 0.1–1.0)
Monocytes Relative: 8 %
Neutro Abs: 4 10*3/uL (ref 1.7–7.7)
Neutrophils Relative %: 73 %
Platelets: 250 10*3/uL (ref 150–400)
RBC: 4.51 MIL/uL (ref 3.87–5.11)
RDW: 11.9 % (ref 11.5–15.5)
WBC: 5.5 10*3/uL (ref 4.0–10.5)
nRBC: 0 % (ref 0.0–0.2)

## 2023-10-28 LAB — LIPASE, BLOOD: Lipase: 30 U/L (ref 11–51)

## 2023-10-28 LAB — COMPREHENSIVE METABOLIC PANEL
ALT: 18 U/L (ref 0–44)
AST: 23 U/L (ref 15–41)
Albumin: 4.4 g/dL (ref 3.5–5.0)
Alkaline Phosphatase: 83 U/L (ref 38–126)
Anion gap: 9 (ref 5–15)
BUN: 13 mg/dL (ref 8–23)
CO2: 25 mmol/L (ref 22–32)
Calcium: 9.1 mg/dL (ref 8.9–10.3)
Chloride: 102 mmol/L (ref 98–111)
Creatinine, Ser: 0.71 mg/dL (ref 0.44–1.00)
GFR, Estimated: >60 mL/min (ref >60)
Glucose, Bld: 103 mg/dL — ABNORMAL HIGH (ref 70–99)
Potassium: 4.2 mmol/L (ref 3.5–5.1)
Sodium: 136 mmol/L (ref 135–145)
Total Bilirubin: 0.5 mg/dL (ref 0.0–1.2)
Total Protein: 7.4 g/dL (ref 6.5–8.1)

## 2023-10-28 LAB — URINALYSIS, W/ REFLEX TO CULTURE (INFECTION SUSPECTED)
Glucose, UA: NEGATIVE mg/dL
Ketones, ur: 15 mg/dL — AB
Leukocytes,Ua: NEGATIVE
Nitrite: NEGATIVE
Protein, ur: 30 mg/dL — AB
Specific Gravity, Urine: >1.03 — ABNORMAL HIGH (ref 1.005–1.030)
pH: 5.5 (ref 5.0–8.0)

## 2023-10-28 MED ORDER — ONDANSETRON HCL 4 MG PO TABS: 4.0000 mg | ORAL_TABLET | Freq: Four times a day (QID) | ORAL | 0 refills | Status: DC | PRN

## 2023-10-28 MED ORDER — ONDANSETRON HCL 4 MG/2ML IJ SOLN
4.0000 mg | Freq: Once | INTRAMUSCULAR | Status: AC
  Administered 2023-10-28: 4 mg via INTRAMUSCULAR

## 2023-10-28 MED ORDER — ONDANSETRON 4 MG PO TBDP
4.0000 mg | ORAL_TABLET | Freq: Once | ORAL | Status: AC
  Administered 2023-10-28: 4 mg via ORAL

## 2023-10-28 MED ORDER — FLUCONAZOLE 150 MG PO TABS: 150.0000 mg | ORAL_TABLET | Freq: Once | ORAL | 0 refills | Status: AC

## 2023-10-28 NOTE — Discharge Instructions (Addendum)
 You lab work did not show cause of your nausea and vomiting. It is important that you stay hydrated. Stop by the pharmacy to pick up your prescriptions.  If symptoms do not improve, go to the emergency department.   Your urine sample showed some yeast therefore an anti-fungal medication called Diflucan  was prescribed.

## 2023-10-28 NOTE — ED Provider Notes (Signed)
 [facility]    CSN: [id number] Arrival date & time: 10/28/23  0911      History   Chief Complaint Chief Complaint  Patient presents with   Emesis   Nausea   Cough    HPI Holly Mclaughlin is a 73 y.o. female.   HPI  History obtained from the patient. Holly Mclaughlin presents for nausea, non-bloody vomiting, cough and sore throat that started on Sunday.  The sore throat has resolved and the cough has greatly improved.  She thinks she had the flu.  No diarrhea. Reports some constipation.   She ate a 1/2 sandwich yesterday but then vomited 30 minutes later.  Last night, she had pain within her shoulder blades and tightness under her breasts.  Denies chest pain, shortness of breath, extremity numbness or tingling nor did she had diaphoresis. No history of DVT/PE, HTN, T2DM, recent traveled or personal history of cancer.   No previous abdominal surgeries. Had a vaginal hysterectomy about 3 years ago.      Past Medical History:  Diagnosis Date   Arthritis    Atypical endometrial hyperplasia    Collagen vascular disease (HCC)    RA   Depression    Disc disease, degenerative, cervical    Fibroid    GERD (gastroesophageal reflux disease)    Hearing loss    Hyperlipidemia    Irritable bowel syndrome with constipation    Osteopenia of multiple sites    Pure hypercholesterolemia     Patient Active Problem List   Diagnosis Date Noted   Atypical endometrial hyperplasia 09/22/2018   Postoperative state 09/22/2018   Pure hypercholesterolemia 08/01/2018   Arthritis 08/01/2018   Abdominal aortic atherosclerosis (HCC) 08/01/2018    Past Surgical History:  Procedure Laterality Date   ABDOMINAL HYSTERECTOMY     COLONOSCOPY     COLONOSCOPY WITH PROPOFOL  N/A 09/07/2021   Procedure: COLONOSCOPY WITH PROPOFOL ;  Surgeon: Onita Elspeth Sharper, DO;  Location: [facility] ENDOSCOPY;  Service: Gastroenterology;  Laterality: N/A;   DILATATION & CURETTAGE/HYSTEROSCOPY WITH MYOSURE N/A  08/11/2018   Procedure: DILATATION & CURETTAGE/HYSTEROSCOPY WITH MYOSURE;  Surgeon: Schermerhorn, Debby PARAS, MD;  Location: [facility] ORS;  Service: Gynecology;  Laterality: N/A;   ESOPHAGOGASTRODUODENOSCOPY     JOINT REPLACEMENT Left 2014   knee   REPLACEMENT TOTAL KNEE Left 2014   SALPINGOOPHORECTOMY Bilateral 09/22/2018   Procedure: SALPINGO OOPHORECTOMY;  Surgeon: Schermerhorn, Debby PARAS, MD;  Location: [facility] ORS;  Service: Gynecology;  Laterality: Bilateral;   TONSILLECTOMY     TUBAL LIGATION     VAGINAL HYSTERECTOMY N/A 09/22/2018   Procedure: HYSTERECTOMY VAGINAL;  Surgeon: Schermerhorn, Debby PARAS, MD;  Location: [facility] ORS;  Service: Gynecology;  Laterality: N/A;    OB History   No obstetric history on file.      Home Medications    Prior to Admission medications   Medication Sig Start Date End Date Taking? Authorizing Provider  acetaminophen  (TYLENOL ) 500 MG tablet Take 500 mg by mouth 2 (two) times daily.    [provider]  atorvastatin  (LIPITOR) 20 MG tablet Take 20 mg by mouth daily.  07/20/18   [provider]  buPROPion  (WELLBUTRIN  XL) 150 MG 24 hr tablet Take 150 mg by mouth daily. 07/14/18   [provider]  cimetidine (TAGAMET) 200 MG tablet Take 200 mg by mouth daily as needed (heartburn).     [provider]  citalopram  (CELEXA ) 20 MG tablet Take 20 mg by mouth daily.    [provider]  cyclobenzaprine (FLEXERIL) 5 MG tablet Take 5 mg by mouth 3 (three) times daily as needed for muscle spasms.    [provider]  Docusate Sodium  (COLACE PO) Take 100 mg by mouth as needed.    [provider]  ibuprofen  (ADVIL ,MOTRIN ) 600 MG tablet Take 1 tablet (600 mg total) by mouth every 8 (eight) hours as needed for moderate pain. 09/23/18   Schermerhorn, Debby PARAS, MD  ondansetron  (ZOFRAN ) 4 MG tablet Take 1 tablet (4 mg total) by mouth every 6 (six) hours as needed for nausea. 10/28/23   Akaisha Truman, DO  simethicone  (MYLICON) 125 MG chewable tablet Chew 125 mg by mouth every 6 (six) hours as needed for flatulence.    [provider]    Family History Family History  Problem Relation Age of Onset   Heart attack Mother    Heart disease Mother    Diabetes type II Mother    Coronary artery disease Mother    Coronary artery disease Father    Heart attack Father    Stroke Father    Angina Father    Coronary artery disease Paternal Grandmother    Heart disease Paternal Grandmother    Breast cancer Paternal Grandmother 11   Breast cancer Paternal Aunt 53   Breast cancer Cousin        2 pat cousins   Breast cancer Cousin        mat cousin    Social History Social History   Tobacco Use   Smoking status: Never   Smokeless tobacco: Never  Vaping Use   Vaping status: Never Used  Substance Use Topics   Alcohol use: Yes    Comment: social   Drug use: Never     Allergies   Lorazepam   Review of Systems Review of Systems: negative unless otherwise stated in HPI.      Physical Exam Triage Vital Signs ED Triage Vitals  Encounter Vitals Group     BP 10/28/23 1002 (!) 153/65     Systolic BP Percentile --      Diastolic BP Percentile --      Pulse Rate 10/28/23 1002 81     Resp 10/28/23 1002 14     Temp 10/28/23 1002 98.4 F (36.9 C)     Temp Source 10/28/23 1002 Oral     SpO2 10/28/23 1002 100 %     Weight 10/28/23 1001 179 lb 14.3 oz (81.6 kg)     Height 10/28/23 1001 4' 10 (1.473 m)     Head Circumference --      Peak Flow --      Pain Score 10/28/23 1001 0     Pain Loc --      Pain Education --      Exclude from Growth Chart --    No data found.  Updated Vital Signs BP (!) 153/65 (BP Location: Left Arm)   Pulse 81   Temp 98.4 F (36.9 C) (Oral)   Resp 14   Ht 4' 10 (1.473 m)   Wt 81.6 kg   SpO2 100%   BMI 37.60 kg/m   Visual Acuity Right Eye Distance:   Left Eye Distance:   Bilateral Distance:    Right Eye Near:   Left Eye Near:    Bilateral  Near:     Physical Exam GEN:     alert, non-toxic appearing  female in no distress    HENT:  mucus membranes moist, oropharyngeal without lesions  or erythema, no tonsillar hypertrophy or exudates, no nasal discharge RESP:  no increased work of breathing, clear to auscultation bilaterally CVS:   regular rate and rhythm ABD:   Soft, nontender, suprapubic tenderness, no guarding, no rebound, active bowel sounds throughout, negative McBurney's, negative Murphy's Skin:   warm and dry    UC Treatments / Results  Labs (all labs ordered are listed, but only abnormal results are displayed) Labs Reviewed  COMPREHENSIVE METABOLIC PANEL - Abnormal; Notable for the following components:      Result Value   Glucose, Bld 103 (*)    All other components within normal limits  URINALYSIS, W/ REFLEX TO CULTURE (INFECTION SUSPECTED) - Abnormal; Notable for the following components:   APPearance HAZY (*)    Specific Gravity, Urine >1.030 (*)    Hgb urine dipstick TRACE (*)    Bilirubin Urine MODERATE (*)    Ketones, ur 15 (*)    Protein, ur 30 (*)    Bacteria, UA FEW (*)    All other components within normal limits  LIPASE, BLOOD  CBC WITH DIFFERENTIAL/PLATELET    EKG   Radiology No results found.   Procedures Procedures (including critical care time)  Medications Ordered in UC Medications  ondansetron  (ZOFRAN -ODT) disintegrating tablet 4 mg (4 mg Oral Given 10/28/23 1008)  ondansetron  (ZOFRAN ) injection 4 mg (4 mg Intramuscular Given 10/28/23 1058)    Initial Impression / Assessment and Plan / UC Course  I have reviewed the triage vital signs and the nursing notes.  Pertinent labs & imaging results that were available during my care of the patient were reviewed by me and considered in my medical decision making (see chart for details).       Pt is a 73 y.o. female who presents for a week of respiratory symptoms with new vomiting.  Zofran  IM and Zofran  p.o. given.  Holly Mclaughlin is afebrile  here without recent antipyretics. Satting well on room air. Overall pt is non-toxic appearing, well hydrated, without respiratory distress. Pulmonary exam is unremarkable.  COVID and influenza panel deferred due to duration of symptoms.  Obtained CMP, CBC, lipase and urinalysis.  CBC and lipase unremarkable.  Doubt pancreatitis, cholecystitis, appendicitis.  She has some mild suprapubic tenderness therefore urinalysis was obtained.  Urinalysis without evidence of acute cystitis however did show some ketonuria, proteinuria and hemoglobinuria.  Additionally, she had some budding yeast and calcium  oxalate stones.  Discussed the possibility of having a kidney stone.  She prefers to wait on going to the emergency department at this time.  Diflucan  prescribed for yeast infection.  Zofran  prescribed for nausea and vomiting.  Strict ED precautions given and she voiced understanding. Discussed MDM, treatment plan and plan for follow-up with patient who agrees with plan.     Final Clinical Impressions(s) / UC Diagnoses   Final diagnoses:  Nausea and vomiting, unspecified vomiting type  Pain of upper abdomen  Acute back pain, unspecified back location, unspecified back pain laterality  Yeast infection     Discharge Instructions      You lab work did not show cause of your nausea and vomiting. It is important that you stay hydrated. Stop by the pharmacy to pick up your prescriptions.  If symptoms do not improve, go to the emergency department.   Your urine sample showed some yeast therefore an anti-fungal medication called Diflucan  was prescribed.       ED Prescriptions     Medication Sig Dispense Auth. Provider   fluconazole  (DIFLUCAN )  150 MG tablet Take 1 tablet (150 mg total) by mouth once for 1 dose. 1 tablet Jadelynn Boylan, DO   ondansetron  (ZOFRAN ) 4 MG tablet Take 1 tablet (4 mg total) by mouth every 6 (six) hours as needed for nausea. 20 tablet Mazal Ebey, DO      PDMP not  reviewed this encounter.   Kriste Berth, DO 11/03/23 1512

## 2023-10-28 NOTE — ED Triage Notes (Signed)
 Patient reports cough and chest congestion for a week.  Patient reports ongoing nausea and vomiting.  Patient reports epigastric pain that radiates to her shoulders last night.

## 2024-04-12 ENCOUNTER — Emergency Department

## 2024-04-12 ENCOUNTER — Emergency Department
Admission: EM | Admit: 2024-04-12 | Discharge: 2024-04-12 | Disposition: A | Discharge status: Home / Self Care | Attending: Emergency Medicine | Admitting: Emergency Medicine

## 2024-04-12 ENCOUNTER — Other Ambulatory Visit: Payer: Self-pay

## 2024-04-12 DIAGNOSIS — S63294A Dislocation of distal interphalangeal joint of right ring finger, initial encounter: Secondary | ICD-10-CM | POA: Diagnosis not present

## 2024-04-12 DIAGNOSIS — W108XXA Fall (on) (from) other stairs and steps, initial encounter: Secondary | ICD-10-CM | POA: Diagnosis not present

## 2024-04-12 DIAGNOSIS — S6991XA Unspecified injury of right wrist, hand and finger(s), initial encounter: Secondary | ICD-10-CM | POA: Diagnosis present

## 2024-04-12 DIAGNOSIS — S63279A Dislocation of unspecified interphalangeal joint of unspecified finger, initial encounter: Secondary | ICD-10-CM

## 2024-04-12 NOTE — Discharge Instructions (Signed)
 Wear the splint for the next 2 weeks except for when taking a shower.  Follow-up with orthopedics or primary care.  Use heat 20 minutes off-and-on throughout the day to help with pain or swelling.  You may also take Tylenol .  Return to the emergency department for symptoms change or worsen if you are unable to schedule an appointment.

## 2024-04-12 NOTE — ED Provider Notes (Signed)
 [facility] Provider Note    Event Date/Time   First MD Initiated Contact with Patient 04/12/24 1415     (approximate)   History   Finger Injury   HPI  Holly Mclaughlin is a 73 y.o. female with history of GERD, hyperlipidemia, IBS and as listed in EMR presents to the emergency department for treatment and evaluation after injury to the right ring finger.  She was carrying Carry up the stairs and lost her balance.  She fell forward.  She has swelling and obvious deformity of the right ring finger and a small scrape from the carrier handle to the side of the finger.  No head strike or loss of consciousness.SABRA     Physical Exam    Vitals:   04/12/24 1358  BP: (!) 145/80  Pulse: 75  Resp: 17  Temp: 97.6 F (36.4 C)  SpO2: 98%    General: Awake, no distress.  CV:  Good peripheral perfusion.  Resp:  Normal effort.  Abd:  No distention.  Other:  Deformity of right ring finger.  Superficial abrasion proximal to the PIP with no associated puncture wound or laceration   ED Results / Procedures / Treatments   Labs (all labs ordered are listed, but only abnormal results are displayed)  Labs Reviewed - No data to display   EKG  Not indicated.   RADIOLOGY  Image and radiology report reviewed and interpreted by me. Radiology report consistent with the same.  Dislocation of the 4th PIP of the right ring finger.  Anatomical alignment of the PIP of the right ring finger.  PROCEDURES:  Critical Care performed: No  .Reduction of dislocation  Date/Time: 04/13/2024 11:05 AM  Performed by: Herlinda Kirk NOVAK, FNP Authorized by: Herlinda Kirk NOVAK, FNP  Consent given by: patient Patient understanding: patient states understanding of the procedure being performed Imaging studies: imaging studies available Patient identity confirmed: verbally with patient Local anesthesia used: no  Anesthesia: Local anesthesia used: no Patient tolerance:  patient tolerated the procedure well with no immediate complications Comments: Manual reduction of dislocation via distraction of right ring finger PIP performed at bedside.  Dislocation appears to have been successfully reduced. Postreduction image ordered.      MEDICATIONS ORDERED IN ED:  Medications - No data to display   IMPRESSION / MDM / ASSESSMENT AND PLAN / ED COURSE   I have reviewed the triage note and vital signs. Vital signs stable.   Differential diagnosis includes, but is not limited to, open/closed finger fracture, PIP dislocation.  Patient's presentation is most consistent with acute illness / injury with system symptoms.  73 year old female presenting to the emergency department for treatment and evaluation after mechanical, nonsyncopal trip while going up steps and landed awkwardly on her right hand causing her right ring finger to be deformed.  She has a superficial abrasion proximal to the deformity.  There is no indication of open fracture.  X-ray indicates dislocation of the PIP of the right ring finger.  Procedure performed as detailed above. X-ray confirms realignment. Static finger splint applied by RN prior to discharge. She was advised to wear the splint for 2 weeks except for shower. Follow up with PCP or orthopedics recommended.       FINAL CLINICAL IMPRESSION(S) / ED DIAGNOSES   Final diagnoses:  Dislocation, finger, interphalangeal joint, initial encounter     Rx / DC Orders   ED Discharge Orders     None  Note:  This document was prepared using Dragon voice recognition software and may include unintentional dictation errors.   Herlinda Kirk NOVAK, FNP 04/13/24 1115    Ernest Ronal BRAVO, MD 04/14/24 [phone]

## 2024-04-12 NOTE — ED Notes (Signed)
 See triage note  Presents with injury to right ring finger s/p fall    states lost balance   Positive deformity noted to finger

## 2024-04-12 NOTE — ED Triage Notes (Signed)
 Pt comes in via pov with an injury to the right ring finger. Pt states that she was carrying a cat carrier up the stairs when she lost her balance, and fell up the stairs. Pt with swelling and obvious deformity to the right ring finger, and a small scrape from the carrier handle to the side of the finger.  Pt has no reports of hitting her head or LOC.

## 2024-05-20 DIAGNOSIS — M1711 Unilateral primary osteoarthritis, right knee: Principal | ICD-10-CM | POA: Insufficient documentation

## 2024-06-23 NOTE — Discharge Instructions (Signed)

## 2024-06-28 ENCOUNTER — Encounter
Admission: RE | Admit: 2024-06-28 | Discharge: 2024-06-28 | Disposition: A | Discharge status: Home / Self Care | Source: Ambulatory Visit | Attending: Orthopedic Surgery | Admitting: Orthopedic Surgery

## 2024-06-28 ENCOUNTER — Other Ambulatory Visit: Payer: Self-pay

## 2024-06-28 VITALS — BP 147/64 | HR 78 | Resp 16 | Ht 59.0 in | Wt 176.8 lb

## 2024-06-28 DIAGNOSIS — Z01818 Encounter for other preprocedural examination: Secondary | ICD-10-CM | POA: Insufficient documentation

## 2024-06-28 DIAGNOSIS — M1711 Unilateral primary osteoarthritis, right knee: Secondary | ICD-10-CM | POA: Diagnosis not present

## 2024-06-28 DIAGNOSIS — Z01812 Encounter for preprocedural laboratory examination: Secondary | ICD-10-CM | POA: Diagnosis present

## 2024-06-28 DIAGNOSIS — Z01810 Encounter for preprocedural cardiovascular examination: Secondary | ICD-10-CM | POA: Diagnosis present

## 2024-06-28 HISTORY — DX: Malignant (primary) neoplasm, unspecified: C80.1

## 2024-06-28 HISTORY — DX: Pneumonia, unspecified organism: J18.9

## 2024-06-28 LAB — COMPREHENSIVE METABOLIC PANEL WITH GFR
ALT: 19 U/L (ref 0–44)
AST: 23 U/L (ref 15–41)
Albumin: 4.1 g/dL (ref 3.5–5.0)
Alkaline Phosphatase: 77 U/L (ref 38–126)
Anion gap: 7 (ref 5–15)
BUN: 14 mg/dL (ref 8–23)
CO2: 27 mmol/L (ref 22–32)
Calcium: 8.9 mg/dL (ref 8.9–10.3)
Chloride: 106 mmol/L (ref 98–111)
Creatinine, Ser: 0.89 mg/dL (ref 0.44–1.00)
GFR, Estimated: >60 mL/min (ref >60)
Glucose, Bld: 86 mg/dL (ref 70–99)
Potassium: 4 mmol/L (ref 3.5–5.1)
Sodium: 140 mmol/L (ref 135–145)
Total Bilirubin: 0.4 mg/dL (ref 0.0–1.2)
Total Protein: 7.3 g/dL (ref 6.5–8.1)

## 2024-06-28 LAB — C-REACTIVE PROTEIN: CRP: 0.5 mg/dL (ref <1.0)

## 2024-06-28 LAB — URINALYSIS, ROUTINE W REFLEX MICROSCOPIC
Bilirubin Urine: NEGATIVE
Glucose, UA: NEGATIVE mg/dL
Hgb urine dipstick: NEGATIVE
Ketones, ur: NEGATIVE mg/dL
Leukocytes,Ua: NEGATIVE
Nitrite: NEGATIVE
Protein, ur: NEGATIVE mg/dL
Specific Gravity, Urine: 1.023 (ref 1.005–1.030)
pH: 5 (ref 5.0–8.0)

## 2024-06-28 LAB — CBC
HCT: 41.3 % (ref 36.0–46.0)
Hemoglobin: 14.1 g/dL (ref 12.0–15.0)
MCH: 31.3 pg (ref 26.0–34.0)
MCHC: 34.1 g/dL (ref 30.0–36.0)
MCV: 91.8 fL (ref 80.0–100.0)
Platelets: 251 K/uL (ref 150–400)
RBC: 4.5 MIL/uL (ref 3.87–5.11)
RDW: 12.4 % (ref 11.5–15.5)
WBC: 4.7 K/uL (ref 4.0–10.5)
nRBC: 0 % (ref 0.0–0.2)

## 2024-06-28 LAB — SURGICAL PCR SCREEN
MRSA, PCR: NEGATIVE
Staphylococcus aureus: NEGATIVE

## 2024-06-28 LAB — SEDIMENTATION RATE: Sed Rate: 15 mm/h (ref 0–30)

## 2024-06-28 NOTE — Patient Instructions (Addendum)
 Your procedure is scheduled on: 07/09/24 - Monday Report to the Registration Desk on the 1st floor of the [facility]. To find out your arrival time, please call [phone] between 1PM - 3PM on: 07/06/24 - Friday If your arrival time is 6:00 am, do not arrive before that time as the [facility] entrance doors do not open until 6:00 am.  REMEMBER: Instructions that are not followed completely may result in serious medical risk, up to and including death; or upon the discretion of your surgeon and anesthesiologist your surgery may need to be rescheduled.  Do not eat food after midnight the night before surgery.  No gum chewing or hard candies.  You may however, drink CLEAR liquids up to 2 hours before you are scheduled to arrive for your surgery. Do not drink anything within 2 hours of your scheduled arrival time.  Clear liquids include: - water   - apple juice without pulp - gatorade (not RED colors) - black coffee or tea (Do NOT add milk or creamers to the coffee or tea) Do NOT drink anything that is not on this list.  In addition, your doctor has ordered for you to drink the provided:  Ensure Pre-Surgery Clear Carbohydrate Drink  Drinking this carbohydrate drink up to two hours before surgery helps to reduce insulin resistance and improve patient outcomes. Please complete drinking 2 hours before scheduled arrival time.  One week prior to surgery: Stop beginning 10/13, Anti-inflammatories (NSAIDS) such as Advil , Aleve, Ibuprofen , Motrin , Naproxen, Naprosyn and Aspirin based products such as Excedrin, Goody's Powder, BC Powder. You may continue to take Tylenol  if needed for pain up until the day of surgery.  Stop ANY OVER THE COUNTER supplements until after surgery.  ON THE DAY OF SURGERY ONLY TAKE THESE MEDICATIONS WITH SIPS OF WATER :  buPROPion (WELLBUTRIN XL)  citalopram (CELEXA    No Alcohol for 24 hours before or after surgery.  No Smoking including e-cigarettes for 24  hours before surgery.  No chewable tobacco products for at least 6 hours before surgery.  No nicotine patches on the day of surgery.  Do not use any recreational drugs for at least a week (preferably 2 weeks) before your surgery.  Please be advised that the combination of cocaine and anesthesia may have negative outcomes, up to and including death. If you test positive for cocaine, your surgery will be cancelled.  On the morning of surgery brush your teeth with toothpaste and water , you may rinse your mouth with mouthwash if you wish. Do not swallow any toothpaste or mouthwash.  Use CHG Soap or wipes as directed on instruction sheet.  Do not wear jewelry, make-up, hairpins, clips or nail polish.  For welded (permanent) jewelry: bracelets, anklets, waist bands, etc.  Please have this removed prior to surgery.  If it is not removed, there is a chance that hospital personnel will need to cut it off on the day of surgery.  Do not wear lotions, powders, or perfumes.   Do not shave body hair from the neck down 48 hours before surgery.  Contact lenses, hearing aids and dentures may not be worn into surgery.  Do not bring valuables to the hospital. [facility] is not responsible for any missing/lost belongings or valuables.   Notify your doctor if there is any change in your medical condition (cold, fever, infection).  Wear comfortable clothing (specific to your surgery type) to the hospital.  After surgery, you can help prevent lung complications by doing breathing  exercises.  Take deep breaths and cough every 1-2 hours. Your doctor may order a device called an Incentive Spirometer to help you take deep breaths.  When coughing or sneezing, hold a pillow firmly against your incision with both hands. This is called "splinting." Doing this helps protect your incision. It also decreases belly discomfort.  If you are being admitted to the hospital overnight, leave your suitcase in the car.  After surgery it may be brought to your room.  In case of increased patient census, it may be necessary for you, the patient, to continue your postoperative care in the Same Day Surgery department.  If you are being discharged the day of surgery, you will not be allowed to drive home. You will need a responsible individual to drive you home and stay with you for 24 hours after surgery.   If you are taking public transportation, you will need to have a responsible individual with you.  Please call the Pre-admissions Testing Dept. at [phone] if you have any questions about these instructions.  Surgery Visitation Policy:  Patients having surgery or a procedure may have two visitors.  Children under the age of 84 must have an adult with them who is not the patient.  Inpatient Visitation:    Visiting hours are 7 a.m. to 8 p.m. Up to four visitors are allowed at one time in a patient room. The visitors may rotate out with other people during the day.  One visitor age 11 or older may stay with the patient overnight and must be in the room by 8 p.m.   Merchandiser, retail to address health-related social needs:  [email or web address]    Pre-operative 4 CHG Bath Instructions   You can play a key role in reducing the risk of infection after surgery. Your skin needs to be as free of germs as possible. You can reduce the number of germs on your skin by washing with CHG (chlorhexidine gluconate) soap before surgery. CHG is an antiseptic soap that kills germs and continues to kill germs even after washing.   DO NOT use if you have an allergy to chlorhexidine/CHG or antibacterial soaps. If your skin becomes reddened or irritated, stop using the CHG and notify one of our RNs at [phone].   Please shower with the CHG soap starting 4 days before surgery using the following schedule: 10/16 - 10/19.    Please keep in mind the following:  DO NOT shave, including legs  and underarms, starting the day of your first shower.   You may shave your face at any point before/day of surgery.  Place clean sheets on your bed the day you start using CHG soap. Use a clean washcloth (not used since being washed) for each shower. DO NOT sleep with pets once you start using the CHG.   CHG Shower Instructions:  If you choose to wash your hair and private area, wash first with your normal shampoo/soap.  After you use shampoo/soap, rinse your hair and body thoroughly to remove shampoo/soap residue.  Turn the water  OFF and apply about 3 tablespoons (45 ml) of CHG soap to a CLEAN washcloth.  Apply CHG soap ONLY FROM YOUR NECK DOWN TO YOUR TOES (washing for 3-5 minutes)  DO NOT use CHG soap on face, private areas, open wounds, or sores.  Pay special attention to the area where your surgery is being performed.  If you are having back surgery, having someone wash your back for you may be  helpful. Wait 2 minutes after CHG soap is applied, then you may rinse off the CHG soap.  Pat dry with a clean towel  Put on clean clothes/pajamas   If you choose to wear lotion, please use ONLY the CHG-compatible lotions on the back of this paper.     Additional instructions for the day of surgery: DO NOT APPLY any lotions, deodorants, cologne, or perfumes.   Put on clean/comfortable clothes.  Brush your teeth.  Ask your nurse before applying any prescription medications to the skin.      CHG Compatible Lotions   Aveeno Moisturizing lotion  Cetaphil Moisturizing Cream  Cetaphil Moisturizing Lotion  Clairol Herbal Essence Moisturizing Lotion, Dry Skin  Clairol Herbal Essence Moisturizing Lotion, Extra Dry Skin  Clairol Herbal Essence Moisturizing Lotion, Normal Skin  Curel Age Defying Therapeutic Moisturizing Lotion with Alpha Hydroxy  Curel Extreme Care Body Lotion  Curel Soothing Hands Moisturizing Hand Lotion  Curel Therapeutic Moisturizing Cream, Fragrance-Free  Curel  Therapeutic Moisturizing Lotion, Fragrance-Free  Curel Therapeutic Moisturizing Lotion, Original Formula  Eucerin Daily Replenishing Lotion  Eucerin Dry Skin Therapy Plus Alpha Hydroxy Crme  Eucerin Dry Skin Therapy Plus Alpha Hydroxy Lotion  Eucerin Original Crme  Eucerin Original Lotion  Eucerin Plus Crme Eucerin Plus Lotion  Eucerin TriLipid Replenishing Lotion  Keri Anti-Bacterial Hand Lotion  Keri Deep Conditioning Original Lotion Dry Skin Formula Softly Scented  Keri Deep Conditioning Original Lotion, Fragrance Free Sensitive Skin Formula  Keri Lotion Fast Absorbing Fragrance Free Sensitive Skin Formula  Keri Lotion Fast Absorbing Softly Scented Dry Skin Formula  Keri Original Lotion  Keri Skin Renewal Lotion Keri Silky Smooth Lotion  Keri Silky Smooth Sensitive Skin Lotion  Nivea Body Creamy Conditioning Oil  Nivea Body Extra Enriched Lotion  Nivea Body Original Lotion  Nivea Body Sheer Moisturizing Lotion Nivea Crme  Nivea Skin Firming Lotion  NutraDerm 30 Skin Lotion  NutraDerm Skin Lotion  NutraDerm Therapeutic Skin Cream  NutraDerm Therapeutic Skin Lotion  ProShield Protective Hand Cream  Provon moisturizing lotion  How to Use an Incentive Spirometer  An incentive spirometer is a tool that measures how well you are filling your lungs with each breath. Learning to take long, deep breaths using this tool can help you keep your lungs clear and active. This may help to reverse or lessen your chance of developing breathing (pulmonary) problems, especially infection. You may be asked to use a spirometer: After a surgery. If you have a lung problem or a history of smoking. After a long period of time when you have been unable to move or be active. If the spirometer includes an indicator to show the highest number that you have reached, your health care provider or respiratory therapist will help you set a goal. Keep a log of your progress as told by your health care  provider. What are the risks? Breathing too quickly may cause dizziness or cause you to pass out. Take your time so you do not get dizzy or light-headed. If you are in pain, you may need to take pain medicine before doing incentive spirometry. It is harder to take a deep breath if you are having pain. How to use your incentive spirometer  Sit up on the edge of your bed or on a chair. Hold the incentive spirometer so that it is in an upright position. Before you use the spirometer, breathe out normally. Place the mouthpiece in your mouth. Make sure your lips are closed tightly around it. Breathe in  slowly and as deeply as you can through your mouth, causing the piston or the ball to rise toward the top of the chamber. Hold your breath for 3-5 seconds, or for as long as possible. If the spirometer includes a coach indicator, use this to guide you in breathing. Slow down your breathing if the indicator goes above the marked areas. Remove the mouthpiece from your mouth and breathe out normally. The piston or ball will return to the bottom of the chamber. Rest for a few seconds, then repeat the steps 10 or more times. Take your time and take a few normal breaths between deep breaths so that you do not get dizzy or light-headed. Do this every 1-2 hours when you are awake. If the spirometer includes a goal marker to show the highest number you have reached (best effort), use this as a goal to work toward during each repetition. After each set of 10 deep breaths, cough a few times. This will help to make sure that your lungs are clear. If you have an incision on your chest or abdomen from surgery, place a pillow or a rolled-up towel firmly against the incision when you cough. This can help to reduce pain while taking deep breaths and coughing. General tips When you are able to get out of bed: Walk around often. Continue to take deep breaths and cough in order to clear your lungs. Keep using the  incentive spirometer until your health care provider says it is okay to stop using it. If you have been in the hospital, you may be told to keep using the spirometer at home. Contact a health care provider if: You are having difficulty using the spirometer. You have trouble using the spirometer as often as instructed. Your pain medicine is not giving enough relief for you to use the spirometer as told. You have a fever. Get help right away if: You develop shortness of breath. You develop a cough with bloody mucus from the lungs. You have fluid or blood coming from an incision site after you cough. Summary An incentive spirometer is a tool that can help you learn to take long, deep breaths to keep your lungs clear and active. You may be asked to use a spirometer after a surgery, if you have a lung problem or a history of smoking, or if you have been inactive for a long period of time. Use your incentive spirometer as instructed every 1-2 hours while you are awake. If you have an incision on your chest or abdomen, place a pillow or a rolled-up towel firmly against your incision when you cough. This will help to reduce pain. Get help right away if you have shortness of breath, you cough up bloody mucus, or blood comes from your incision when you cough. This information is not intended to replace advice given to you by your health care provider. Make sure you discuss any questions you have with your health care provider. Document Revised: 11/26/2019 Document Reviewed: 11/26/2019 Elsevier Patient Education  2023 Elsevier Inc.  POLAR CARE INFORMATION  MassAdvertisement.it  How to use [facility] Therapy System?  YouTube   ShippingScam.co.uk  OPERATING INSTRUCTIONS  Start the product With dry hands, connect the transformer to the electrical connection located on the top of the cooler. Next, plug the transformer into an appropriate electrical outlet. The unit  will automatically start running at this point.  To stop the pump, disconnect electrical power.  Unplug to stop the  product when not in use. Unplugging the Polar Care unit turns it off. Always unplug immediately after use. Never leave it plugged in while unattended. Remove pad.    FIRST ADD WATER  TO FILL LINE, THEN ICE---Replace ice when existing ice is almost melted  1 Discuss Treatment with your Licensed Health Care Practitioner and Use Only as Prescribed 2 Apply Insulation Barrier & Cold Therapy Pad 3 Check for Moisture 4 Inspect Skin Regularly  Tips and Trouble Shooting Usage Tips 1. Use cubed or chunked ice for optimal performance. 2. It is recommended to drain the Pad between uses. To drain the pad, hold the Pad upright with the hose pointed toward the ground. Depress the black plunger and allow water  to drain out. 3. You may disconnect the Pad from the unit without removing the pad from the affected area by depressing the silver tabs on the hose coupling and gently pulling the hoses apart. The Pad and unit will seal itself and will not leak. Note: Some dripping during release is normal. 4. DO NOT RUN PUMP WITHOUT WATER ! The pump in this unit is designed to run with water . Running the unit without water  will cause permanent damage to the pump. 5. Unplug unit before removing lid.  TROUBLESHOOTING GUIDE Pump not running, Water  not flowing to the pad, Pad is not getting cold 1. Make sure the transformer is plugged into the wall outlet. 2. Confirm that the ice and water  are filled to the indicated levels. 3. Make sure there are no kinks in the pad. 4. Gently pull on the blue tube to make sure the tube/pad junction is straight. 5. Remove the pad from the treatment site and ll it while the pad is lying at; then reapply. 6. Confirm that the pad couplings are securely attached to the unit. Listen for the double clicks (Figure 1) to confirm the pad couplings are securely attached.  Leaks     Note: Some condensation on the lines, controller, and pads is unavoidable, especially in warmer climates. 1. If using a [employer] Polar Care Cold Therapy unit with a detachable Cold Therapy Pad, and a leak exists (other than condensation on the lines) disconnect the pad couplings. Make sure the silver tabs on the couplings are depressed before reconnecting the pad to the pump hose; then confirm both sides of the coupling are properly clicked in. 2. If the coupling continues to leak or a leak is detected in the pad itself, stop using it and call [employer] Customer Care at [phone].  Cleaning After use, empty and dry the unit with a soft cloth. Warm water  and mild detergent may be used occasionally to clean the pump and tubes.  WARNING: The Polar Care Cube can be cold enough to cause serious injury, including full skin necrosis. Follow these Operating Instructions, and carefully read the Product Insert (see pouch on side of unit) and the Cold Therapy Pad Fitting Instructions (provided with each Cold Therapy Pad) prior to use.               Preoperative Educational Videos for Total Hip, Knee and Shoulder Replacements  To better prepare for surgery, please view our videos that explain the physical activity and discharge planning required to have the best surgical recovery at [facility].  [email or web address]  Questions? Call [phone] or email [email or web address]

## 2024-07-08 ENCOUNTER — Encounter: Payer: Self-pay | Admitting: Orthopedic Surgery

## 2024-07-08 DIAGNOSIS — F32A Depression, unspecified: Secondary | ICD-10-CM | POA: Insufficient documentation

## 2024-07-08 DIAGNOSIS — H905 Unspecified sensorineural hearing loss: Secondary | ICD-10-CM | POA: Insufficient documentation

## 2024-07-08 NOTE — H&P (Signed)
 ORTHOPAEDIC HISTORY & PHYSICAL Drake Fonda Loving, [place] - 07/03/2024 9:30 AM EDT Formatting of this note is different from the original. NAME: Holly Mclaughlin H&P Date: 07/03/2024 Procedure Date: 07/09/2024  Chief Complaint: right knee pain and swelling  HPI Holly Mclaughlin is a 73 y.o. female who has severe Right knee pain. Patient reports a several year history of progressively worsening right knee discomfort. She states that most the pain localizes along the medial aspect of the knee. She states that it is made worse with any prolonged weightbearing or ambulation. She does report intermittent swelling and giving way of the knee. She denies any locking or maltracking symptoms. She reports that it greatly affects her ability to ambulate long distances and perform her ADLs as she normally would like. She has failed conservative treatment including Tylenol , NSAIDs and activity modification. She is not currently utilizing any ambulatory aids. She has requested operative intervention for relief of her DJD symptoms. She denies any previous cardiac issues. She does report a history of post infectious pneumonia from COVID about a year and a half ago. She denies any previous DVTs or clots. She is not a diabetic. She does have a history of a left total knee arthroplasty that was performed by Dr. Mardee over 10 years ago, but denies any previous surgeries on her right knee.  Of note, the patient states that she would like to do her outpatient PT at the [facility] clinic in [place] office.  Social Hx: Patient is retired and lives at home with her husband will be helping look after her postoperatively. She denies any alcohol use, illicit drug use, smoking or nicotine use.  Medications & Allergies Allergies: Allergies Allergen Reactions Ativan [Lorazepam] Itching  Home Medicines: Current Outpatient Medications on File Prior to Visit Medication Sig Dispense Refill acetaminophen  (TYLENOL ) 650 MG ER  tablet Take 650 mg by mouth every 8 (eight) hours as needed. amoxicillin (AMOXIL) 500 MG capsule Take 4 capsules 1 hour before the dental procedure 4 capsule 2 atorvastatin (LIPITOR) 20 MG tablet Take 1 tablet (20 mg total) by mouth once daily 100 tablet 3 bisacodyL (DULCOLAX) 5 mg EC tablet Take 20 mg by mouth once daily as needed for Constipation buPROPion (WELLBUTRIN XL) 150 MG XL tablet Take 1 tablet (150 mg total) by mouth once daily 100 tablet 3 citalopram (CELEXA) 20 MG tablet Take 1 tablet (20 mg total) by mouth once daily 100 tablet 3 docusate (COLACE) 100 MG capsule Take 300 mg by mouth every morning famotidine  (PEPCID ) 20 MG tablet Take 20 mg by mouth 2 (two) times daily as needed for Heartburn omeprazole 20 mg TbLD Take 20 mg by mouth once daily as needed simethicone 500 mg Cap Take 500 mg by mouth once daily as needed (gas/indigestion)  No current facility-administered medications on file prior to visit.  Medical / Surgical History  Past Medical History: Diagnosis Date Arthritis LEFT KNEE Depression Hearing loss, central WEAR HEARING AIDS Hyperlipidemia Irritable bowel syndrome with constipation 06/28/2014   Past Surgical History: Procedure Laterality Date COLONOSCOPY N/A 12/11/1998 Dr. CHARM Punch @ [facility] - Adenomatous Polyp, FHPolyps(m) COLONOSCOPY N/A 06/17/2005 Dr. CHARM Punch @ [facility] - Nml, PHPolyp COLONOSCOPY N/A 06/29/2010 Dr. SHAUNNA. Oh @ [facility] - Nml, PHPolyp EGD N/A 04/14/2011 Dr. SHAUNNA. Oh @ [facility] - mild gastritis, no rpt per [facility] Left total knee arthroplasty 11/13/2012 Dr. Mardee COLONOSCOPY N/A 09/09/2015 Dr. SHAUNNA. Oh @ [facility] - Nml, PHPolyp, rpt 5 yrs per [facility] Fx D&C, hysteroscopy with Myosure 08/11/2018 [facility]  with left salpingectomy 09/22/2018 HYSTERECTOMY 09/22/2018 [facility] left salpingectomy COLONOSCOPY 09/07/2021 Tubular adenomas/Hyperplastic polyp/PHx CP/Repeat 12yrs/SMR TONSILLECTOMY CHILD TUBAL LIGATION BTL   Physical Exam  Ht:149.9 cm (4' 11) Wt:80.4 kg (177 lb  3.2 oz) BMI: Body mass index is 35.79 kg/m.  General/Constitutional: No apparent distress: well-nourished and well developed. Eyes: Pupils equal, round with synchronous movement. Lymphatic: No palpable adenopathy. Respiratory: Patient has good chest rise and fall with inspiration and expiration. All lung fields are clear to auscultation bilaterally. There is no Rales, rhonchi or wheezes appreciated. Cardiovascular: Upon auscultation there is a regular rate and rhythm without any murmurs, rubs, gallops or heaves appreciated. There does not appear to be any swelling down the lower extremities. Posterior tibial pulses appreciated bilaterally, 2+. Integumentary: No impressive skin lesions present, except as noted in detailed exam. Neuro/Psych: Normal mood and affect, oriented to person, place and time. Musculoskeletal: see exam below  Left knee exam Upon inspection of the patient's left knee there does not appear to be any skin changes, open abrasions, or redness. Mild swelling appreciated there is a varus/neutral/valgus alignment. Upon palpation, the patient reports having pain along the medial aspect of their knee. Patient has 5 degrees off of full extension actively with ROM, and able to flex back to 117 degrees of flexion with mild pain. Varus and valgus stress testing shows positive pseudolaxity to valgus stressing. The patella tracks well within the femoral groove from flexion into extension with appreciable crepitus. Anterior and posterior drawer testing negative. Patient is neurovascularly intact down their lower extremity to all dermatomes. Posterior tibial pulses appreciated 2+.  Imaging right Knee Imaging: None ordered today. Previous images from 05/15/2024 were reviewed. Upon inspection, there is noticeable loss of cartilage space along the medial aspect of the patient's knee. Bone-on-bone articulation is noted. Subchondral changes are appreciated. Osteophyte formation is present. Overall  alignment is relative varus. No fractures, lytic lesions or gross deformities appreciated on films.  Assesment and Plan Knee DJD  I have recommended that Mianna Iezzi undergo right total knee replacement. Consents has been signed. The risks, benefits, prognosis and alternatives including but not limited to DVT, PE, infection, neurovascular injury, failure of the procedure and death were explained to the patient and she is willing to proceed with surgery as described to her by myself. Plan will be for post operative admission of at least 1 midnight for pain control and PT. She will be managed with DVT prophylaxis, antibiotics preoperatively for 24 hours and aggressive in patient rehab.  Pre, intra and post op interventions were discussed. Patient has good understanding  Medication Reconciliation was performed. Discussed cessation of vitamins and supplements.  A total of 45 minutes was spent reviewing patient's charts, medical reconciliation, discussing/educating the patient about surgical interventions, and answering any questions provided by the patient.  JOSHUA DALLAS KOYANAGI, PA [facility] 07/03/2024  Electronically signed by KOYANAGI Fonda DALLAS, PA at 07/03/2024 6:44 PM EDT

## 2024-07-09 ENCOUNTER — Encounter
Admission: RE | Disposition: A | Discharge status: Home / Self Care | Payer: Self-pay | Source: Home / Self Care | Attending: Orthopedic Surgery

## 2024-07-09 ENCOUNTER — Encounter: Payer: Self-pay | Admitting: Orthopedic Surgery

## 2024-07-09 ENCOUNTER — Ambulatory Visit: Payer: Self-pay | Admitting: Urgent Care

## 2024-07-09 ENCOUNTER — Observation Stay
Admission: RE | Admit: 2024-07-09 | Discharge: 2024-07-10 | Disposition: A | Discharge status: Home / Self Care | Attending: Orthopedic Surgery | Admitting: Orthopedic Surgery

## 2024-07-09 ENCOUNTER — Other Ambulatory Visit: Payer: Self-pay

## 2024-07-09 ENCOUNTER — Observation Stay

## 2024-07-09 ENCOUNTER — Ambulatory Visit: Admitting: Anesthesiology

## 2024-07-09 DIAGNOSIS — Z7982 Long term (current) use of aspirin: Secondary | ICD-10-CM | POA: Diagnosis not present

## 2024-07-09 DIAGNOSIS — M1711 Unilateral primary osteoarthritis, right knee: Principal | ICD-10-CM | POA: Insufficient documentation

## 2024-07-09 DIAGNOSIS — Z79899 Other long term (current) drug therapy: Secondary | ICD-10-CM | POA: Insufficient documentation

## 2024-07-09 DIAGNOSIS — Z96651 Presence of right artificial knee joint: Secondary | ICD-10-CM | POA: Diagnosis not present

## 2024-07-09 HISTORY — PX: KNEE ARTHROPLASTY: SHX992

## 2024-07-09 SURGERY — ARTHROPLASTY, KNEE, TOTAL, USING IMAGELESS COMPUTER-ASSISTED NAVIGATION
Anesthesia: Spinal | Site: Knee | Laterality: Right

## 2024-07-09 MED ORDER — LACTATED RINGERS IV SOLN: INTRAVENOUS | Status: DC

## 2024-07-09 MED ORDER — ENSURE PRE-SURGERY PO LIQD
296.0000 mL | Freq: Once | ORAL | Status: DC
  Administered 2024-07-09: 296 mL via ORAL
  Filled 2024-07-09: qty 296

## 2024-07-09 MED ORDER — CITALOPRAM HYDROBROMIDE 20 MG PO TABS
20.0000 mg | ORAL_TABLET | Freq: Every morning | ORAL | Status: DC
  Administered 2024-07-10: 20 mg via ORAL
  Filled 2024-07-09: qty 1

## 2024-07-09 MED ORDER — OXYCODONE HCL 5 MG PO TABS: 10.0000 mg | ORAL_TABLET | ORAL | Status: DC | PRN

## 2024-07-09 MED ORDER — GABAPENTIN 300 MG PO CAPS
ORAL_CAPSULE | ORAL | Status: AC
Start: 2024-07-09 — End: 2024-07-09
  Filled 2024-07-09: qty 1

## 2024-07-09 MED ORDER — CHLORHEXIDINE GLUCONATE 0.12 % MT SOLN
15.0000 mL | Freq: Once | OROMUCOSAL | Status: AC
  Administered 2024-07-09: 15 mL via OROMUCOSAL

## 2024-07-09 MED ORDER — CELECOXIB 200 MG PO CAPS
400.0000 mg | ORAL_CAPSULE | Freq: Once | ORAL | Status: AC
  Administered 2024-07-09: 400 mg via ORAL

## 2024-07-09 MED ORDER — SODIUM CHLORIDE 0.9 % IV SOLN: INTRAVENOUS | Status: DC

## 2024-07-09 MED ORDER — TRANEXAMIC ACID-NACL 1000-0.7 MG/100ML-% IV SOLN
INTRAVENOUS | Status: AC
  Filled 2024-07-09: qty 100

## 2024-07-09 MED ORDER — ACETAMINOPHEN 10 MG/ML IV SOLN
1000.0000 mg | Freq: Four times a day (QID) | INTRAVENOUS | Status: AC
Start: 2024-07-09 — End: 2024-07-10
  Administered 2024-07-09 – 2024-07-10 (×4): 1000 mg via INTRAVENOUS
  Filled 2024-07-09 (×4): qty 100

## 2024-07-09 MED ORDER — OXYCODONE HCL 5 MG PO TABS: 5.0000 mg | ORAL_TABLET | ORAL | Status: DC | PRN

## 2024-07-09 MED ORDER — FLEET ENEMA RE ENEM: 1.0000 | ENEMA | Freq: Once | RECTAL | Status: DC | PRN

## 2024-07-09 MED ORDER — PHENOL 1.4 % MT LIQD: 1.0000 | OROMUCOSAL | Status: DC | PRN

## 2024-07-09 MED ORDER — CHLORHEXIDINE GLUCONATE 4 % EX SOLN
60.0000 mL | Freq: Once | CUTANEOUS | Status: DC
  Administered 2024-07-09: 4 via TOPICAL

## 2024-07-09 MED ORDER — PROPOFOL 500 MG/50ML IV EMUL
INTRAVENOUS | Status: DC | PRN
  Administered 2024-07-09: 50 mg via INTRAVENOUS
  Administered 2024-07-09: 100 ug/kg/min via INTRAVENOUS
  Administered 2024-07-09: 115 ug/kg/min via INTRAVENOUS

## 2024-07-09 MED ORDER — ASPIRIN 81 MG PO CHEW
81.0000 mg | CHEWABLE_TABLET | Freq: Two times a day (BID) | ORAL | Status: DC
  Administered 2024-07-10: 81 mg via ORAL
  Filled 2024-07-09: qty 1

## 2024-07-09 MED ORDER — BUPROPION HCL ER (XL) 150 MG PO TB24
150.0000 mg | ORAL_TABLET | Freq: Every morning | ORAL | Status: DC
  Administered 2024-07-10: 150 mg via ORAL
  Filled 2024-07-09: qty 1

## 2024-07-09 MED ORDER — PROPOFOL 1000 MG/100ML IV EMUL
INTRAVENOUS | Status: AC
  Filled 2024-07-09: qty 100

## 2024-07-09 MED ORDER — METOCLOPRAMIDE HCL 10 MG PO TABS
10.0000 mg | ORAL_TABLET | Freq: Three times a day (TID) | ORAL | Status: DC
Start: 2024-07-09 — End: 2024-07-11
  Administered 2024-07-09 – 2024-07-10 (×4): 10 mg via ORAL
  Filled 2024-07-09 (×4): qty 1

## 2024-07-09 MED ORDER — ACETAMINOPHEN 10 MG/ML IV SOLN
INTRAVENOUS | Status: DC | PRN
  Administered 2024-07-09: 1000 mg via INTRAVENOUS

## 2024-07-09 MED ORDER — PANTOPRAZOLE SODIUM 40 MG PO TBEC
40.0000 mg | DELAYED_RELEASE_TABLET | Freq: Two times a day (BID) | ORAL | Status: DC
  Administered 2024-07-09 – 2024-07-10 (×3): 40 mg via ORAL
  Filled 2024-07-09 (×3): qty 1

## 2024-07-09 MED ORDER — GABAPENTIN 300 MG PO CAPS
300.0000 mg | ORAL_CAPSULE | Freq: Once | ORAL | Status: AC
  Administered 2024-07-09: 300 mg via ORAL

## 2024-07-09 MED ORDER — MIDAZOLAM HCL 5 MG/5ML IJ SOLN
INTRAMUSCULAR | Status: DC | PRN
  Administered 2024-07-09: 2 mg via INTRAVENOUS

## 2024-07-09 MED ORDER — CEFAZOLIN SODIUM-DEXTROSE 2-4 GM/100ML-% IV SOLN
2.0000 g | INTRAVENOUS | Status: AC
  Administered 2024-07-09: 2 g via INTRAVENOUS

## 2024-07-09 MED ORDER — BUPIVACAINE HCL (PF) 0.5 % IJ SOLN
INTRAMUSCULAR | Status: DC | PRN
  Administered 2024-07-09: 30 mL

## 2024-07-09 MED ORDER — CELECOXIB 200 MG PO CAPS
200.0000 mg | ORAL_CAPSULE | Freq: Two times a day (BID) | ORAL | Status: DC
  Administered 2024-07-09 – 2024-07-10 (×2): 200 mg via ORAL
  Filled 2024-07-09 (×3): qty 1

## 2024-07-09 MED ORDER — BUPIVACAINE HCL (PF) 0.5 % IJ SOLN
INTRAMUSCULAR | Status: AC
  Filled 2024-07-09: qty 10

## 2024-07-09 MED ORDER — DIPHENHYDRAMINE HCL 12.5 MG/5ML PO ELIX: 12.5000 mg | ORAL_SOLUTION | ORAL | Status: DC | PRN

## 2024-07-09 MED ORDER — TRANEXAMIC ACID-NACL 1000-0.7 MG/100ML-% IV SOLN
1000.0000 mg | Freq: Once | INTRAVENOUS | Status: AC
  Administered 2024-07-09: 1000 mg via INTRAVENOUS

## 2024-07-09 MED ORDER — SODIUM CHLORIDE 0.9 % IR SOLN
Status: DC | PRN
  Administered 2024-07-09: 3000 mL

## 2024-07-09 MED ORDER — ONDANSETRON HCL 4 MG PO TABS
4.0000 mg | ORAL_TABLET | Freq: Four times a day (QID) | ORAL | Status: DC | PRN
  Administered 2024-07-10: 4 mg via ORAL
  Filled 2024-07-09: qty 1

## 2024-07-09 MED ORDER — HYDROMORPHONE HCL 1 MG/ML IJ SOLN: 0.5000 mg | INTRAMUSCULAR | Status: DC | PRN

## 2024-07-09 MED ORDER — FENTANYL CITRATE (PF) 100 MCG/2ML IJ SOLN: 25.0000 ug | INTRAMUSCULAR | Status: DC | PRN

## 2024-07-09 MED ORDER — MAGNESIUM HYDROXIDE 400 MG/5ML PO SUSP
30.0000 mL | Freq: Every day | ORAL | Status: DC
  Administered 2024-07-10: 30 mL via ORAL
  Filled 2024-07-09: qty 30

## 2024-07-09 MED ORDER — CEFAZOLIN SODIUM-DEXTROSE 2-4 GM/100ML-% IV SOLN
INTRAVENOUS | Status: AC
  Filled 2024-07-09: qty 100

## 2024-07-09 MED ORDER — ALUM & MAG HYDROXIDE-SIMETH 200-200-20 MG/5ML PO SUSP: 30.0000 mL | ORAL | Status: DC | PRN

## 2024-07-09 MED ORDER — MIDAZOLAM HCL 2 MG/2ML IJ SOLN
INTRAMUSCULAR | Status: AC
  Filled 2024-07-09: qty 2

## 2024-07-09 MED ORDER — ONDANSETRON HCL 4 MG/2ML IJ SOLN: 4.0000 mg | Freq: Four times a day (QID) | INTRAMUSCULAR | Status: DC | PRN

## 2024-07-09 MED ORDER — TRANEXAMIC ACID-NACL 1000-0.7 MG/100ML-% IV SOLN
INTRAVENOUS | Status: AC
Start: 2024-07-09 — End: 2024-07-09
  Filled 2024-07-09: qty 100

## 2024-07-09 MED ORDER — CELECOXIB 200 MG PO CAPS
ORAL_CAPSULE | ORAL | Status: AC
Start: 2024-07-09 — End: 2024-07-09
  Filled 2024-07-09: qty 2

## 2024-07-09 MED ORDER — ACETAMINOPHEN 325 MG PO TABS: 325.0000 mg | ORAL_TABLET | Freq: Four times a day (QID) | ORAL | Status: DC | PRN

## 2024-07-09 MED ORDER — TRANEXAMIC ACID-NACL 1000-0.7 MG/100ML-% IV SOLN
INTRAVENOUS | Status: DC | PRN
  Administered 2024-07-09: 1000 mg via INTRAVENOUS

## 2024-07-09 MED ORDER — BUPIVACAINE LIPOSOME 1.3 % IJ SUSP
INTRAMUSCULAR | Status: DC | PRN
  Administered 2024-07-09: 60 mL

## 2024-07-09 MED ORDER — ATORVASTATIN CALCIUM 10 MG PO TABS
20.0000 mg | ORAL_TABLET | Freq: Every day | ORAL | Status: DC
  Administered 2024-07-09: 20 mg via ORAL
  Filled 2024-07-09: qty 2

## 2024-07-09 MED ORDER — SENNOSIDES-DOCUSATE SODIUM 8.6-50 MG PO TABS
1.0000 | ORAL_TABLET | Freq: Two times a day (BID) | ORAL | Status: DC
Start: 2024-07-09 — End: 2024-07-10
  Administered 2024-07-09 – 2024-07-10 (×2): 1 via ORAL
  Filled 2024-07-09 (×2): qty 1

## 2024-07-09 MED ORDER — SURGIPHOR WOUND IRRIGATION SYSTEM - OPTIME
TOPICAL | Status: DC | PRN
  Administered 2024-07-09: 450 mL via TOPICAL

## 2024-07-09 MED ORDER — FERROUS SULFATE 325 (65 FE) MG PO TABS
325.0000 mg | ORAL_TABLET | Freq: Two times a day (BID) | ORAL | Status: DC
  Administered 2024-07-09 – 2024-07-10 (×2): 325 mg via ORAL
  Filled 2024-07-09 (×2): qty 1

## 2024-07-09 MED ORDER — MENTHOL 3 MG MT LOZG: 1.0000 | LOZENGE | OROMUCOSAL | Status: DC | PRN

## 2024-07-09 MED ORDER — CEFAZOLIN SODIUM-DEXTROSE 2-4 GM/100ML-% IV SOLN
2.0000 g | Freq: Four times a day (QID) | INTRAVENOUS | Status: AC
  Administered 2024-07-09 (×2): 2 g via INTRAVENOUS
  Filled 2024-07-09 (×2): qty 100

## 2024-07-09 MED ORDER — ACETAMINOPHEN 10 MG/ML IV SOLN
INTRAVENOUS | Status: AC
  Filled 2024-07-09: qty 100

## 2024-07-09 MED ORDER — LIDOCAINE HCL (PF) 2 % IJ SOLN
INTRAMUSCULAR | Status: AC
  Filled 2024-07-09: qty 5

## 2024-07-09 MED ORDER — DEXAMETHASONE SOD PHOSPHATE PF 10 MG/ML IJ SOLN
8.0000 mg | Freq: Once | INTRAMUSCULAR | Status: AC
  Administered 2024-07-09: 8 mg via INTRAVENOUS

## 2024-07-09 MED ORDER — BUPIVACAINE HCL (PF) 0.25 % IJ SOLN
INTRAMUSCULAR | Status: DC | PRN
  Administered 2024-07-09: 60 mL

## 2024-07-09 MED ORDER — CHLORHEXIDINE GLUCONATE 0.12 % MT SOLN
OROMUCOSAL | Status: AC
Start: 2024-07-09 — End: 2024-07-09
  Filled 2024-07-09: qty 15

## 2024-07-09 MED ORDER — ORAL CARE MOUTH RINSE: 15.0000 mL | Freq: Once | OROMUCOSAL | Status: AC

## 2024-07-09 MED ORDER — TRAMADOL HCL 50 MG PO TABS
50.0000 mg | ORAL_TABLET | ORAL | Status: DC | PRN
  Administered 2024-07-09 (×2): 50 mg via ORAL
  Filled 2024-07-09 (×2): qty 1

## 2024-07-09 MED ORDER — TRANEXAMIC ACID-NACL 1000-0.7 MG/100ML-% IV SOLN
1000.0000 mg | INTRAVENOUS | Status: DC
Start: 2024-07-09 — End: 2024-07-09

## 2024-07-09 MED ORDER — BISACODYL 10 MG RE SUPP: 10.0000 mg | Freq: Every day | RECTAL | Status: DC | PRN

## 2024-07-09 SURGICAL SUPPLY — 65 items
ATTUNE MED DOME PAT 32 KNEE (Knees) IMPLANT
ATTUNE PSFEM RTSZ5 NARCEM KNEE (Femur) IMPLANT
ATTUNE PSRP INSR SZ5 6 KNEE (Insert) IMPLANT
BASEPLATE TIBIAL ROTATING SZ 4 (Knees) IMPLANT
BATTERY INSTRU NAVIGATION (MISCELLANEOUS) ×4 IMPLANT
BIT DRILL QUICK REL 1/8 2PK SL (BIT) ×1 IMPLANT
BLADE CLIPPER SURG (BLADE) IMPLANT
BLADE SAW 70X12.5 (BLADE) ×1 IMPLANT
BLADE SAW 90X13X1.19 OSCILLAT (BLADE) ×1 IMPLANT
BLADE SAW 90X25X1.19 OSCILLAT (BLADE) ×1 IMPLANT
BRUSH SCRUB EZ PLAIN DRY (MISCELLANEOUS) ×1 IMPLANT
CEMENT HV SMART SET (Cement) IMPLANT
COOLER ICEMAN CLASSIC (MISCELLANEOUS) ×1 IMPLANT
CUFF TRNQT CYL 24X4X16.5-23 (TOURNIQUET CUFF) IMPLANT
CUFF TRNQT CYL 30X4X21-28X (TOURNIQUET CUFF) IMPLANT
DRAPE SHEET LG 3/4 BI-LAMINATE (DRAPES) ×1 IMPLANT
DRSG AQUACEL AG ADV 3.5X14 (GAUZE/BANDAGES/DRESSINGS) ×1 IMPLANT
DRSG MEPILEX SACRM 8.7X9.8 (GAUZE/BANDAGES/DRESSINGS) ×1 IMPLANT
DRSG OPSITE POSTOP 4X14 (GAUZE/BANDAGES/DRESSINGS) IMPLANT
DRSG TEGADERM 4X4.75 (GAUZE/BANDAGES/DRESSINGS) ×1 IMPLANT
DRSG XEROFORM 1X8 (GAUZE/BANDAGES/DRESSINGS) IMPLANT
DURAPREP 26ML APPLICATOR (WOUND CARE) ×2 IMPLANT
ELECT CAUTERY BLADE 6.4 (BLADE) ×1 IMPLANT
ELECTRODE REM PT RTRN 9FT ADLT (ELECTROSURGICAL) ×1 IMPLANT
EVACUATOR 1/8 PVC DRAIN (DRAIN) ×1 IMPLANT
EX-PIN ORTHOLOCK NAV 4X150 (PIN) ×2 IMPLANT
GAUZE XEROFORM 1X8 LF (GAUZE/BANDAGES/DRESSINGS) ×1 IMPLANT
GLOVE BIOGEL M STRL SZ7.5 (GLOVE) ×6 IMPLANT
GLOVE BIOGEL PI IND STRL 8 (GLOVE) ×1 IMPLANT
GLOVE SRG 8 PF TXTR STRL LF DI (GLOVE) ×1 IMPLANT
GOWN STRL REUS W/ TWL LRG LVL3 (GOWN DISPOSABLE) ×1 IMPLANT
GOWN STRL REUS W/ TWL XL LVL3 (GOWN DISPOSABLE) ×1 IMPLANT
GOWN TOGA ZIPPER T7+ PEEL AWAY (MISCELLANEOUS) ×1 IMPLANT
HOLDER FOLEY CATH W/STRAP (MISCELLANEOUS) ×1 IMPLANT
HOOD PEEL AWAY T7 (MISCELLANEOUS) ×1 IMPLANT
KIT TURNOVER KIT A (KITS) ×1 IMPLANT
KNIFE SCULPS 14X20 (INSTRUMENTS) ×1 IMPLANT
MANIFOLD NEPTUNE II (INSTRUMENTS) ×2 IMPLANT
NDL SPNL 20GX3.5 QUINCKE YW (NEEDLE) ×2 IMPLANT
NEEDLE SPNL 20GX3.5 QUINCKE YW (NEEDLE) ×2 IMPLANT
PACK TOTAL KNEE (MISCELLANEOUS) ×1 IMPLANT
PAD ABD DERMACEA PRESS 5X9 (GAUZE/BANDAGES/DRESSINGS) ×2 IMPLANT
PAD ARMBOARD POSITIONER FOAM (MISCELLANEOUS) ×3 IMPLANT
PAD COLD UNI WRAP-ON (PAD) ×1 IMPLANT
PENCIL SMOKE EVACUATOR COATED (MISCELLANEOUS) ×1 IMPLANT
PIN DRILL FIX HALF THREAD (BIT) ×2 IMPLANT
PIN FIXATION 1/8DIA X 3INL (PIN) ×1 IMPLANT
SOL .9 NS 3000ML IRR UROMATIC (IV SOLUTION) ×1 IMPLANT
SOLN STERILE WATER BTL 1000 ML (IV SOLUTION) ×1 IMPLANT
SOLUTION IRRIG SURGIPHOR (IV SOLUTION) ×1 IMPLANT
SPONGE DRAIN TRACH 4X4 STRL 2S (GAUZE/BANDAGES/DRESSINGS) ×1 IMPLANT
STAPLER SKIN PROX 35W (STAPLE) ×1 IMPLANT
STOCKINETTE IMPERV 14X48 (MISCELLANEOUS) ×1 IMPLANT
STOCKINETTE STRL BIAS CUT 8X4 (MISCELLANEOUS) ×1 IMPLANT
STRAP TIBIA SHORT (MISCELLANEOUS) ×1 IMPLANT
SUCTION TUBE FRAZIER 10FR DISP (SUCTIONS) ×1 IMPLANT
SUT VIC AB 0 CT1 36 (SUTURE) ×1 IMPLANT
SUT VIC AB 1 CT1 36 (SUTURE) ×2 IMPLANT
SUT VIC AB 2-0 CT2 27 (SUTURE) ×1 IMPLANT
SYR 30ML LL (SYRINGE) ×2 IMPLANT
TIP FAN IRRIG PULSAVAC PLUS (DISPOSABLE) ×1 IMPLANT
TOWEL OR 17X26 4PK STRL BLUE (TOWEL DISPOSABLE) IMPLANT
TOWER CARTRIDGE SMART MIX (DISPOSABLE) ×1 IMPLANT
TRAP FLUID SMOKE EVACUATOR (MISCELLANEOUS) ×1 IMPLANT
TRAY FOLEY MTR SLVR 16FR STAT (SET/KITS/TRAYS/PACK) ×1 IMPLANT

## 2024-07-09 NOTE — Interval H&P Note (Signed)
 History and Physical Interval Note:  07/09/2024 6:08 AM  Holly Mclaughlin  has presented today for surgery, with the diagnosis of Primary osteoarthritis of right knee.  The various methods of treatment have been discussed with the patient and family. After consideration of risks, benefits and other options for treatment, the patient has consented to  Procedure(s): ARTHROPLASTY, KNEE, TOTAL, USING IMAGELESS COMPUTER-ASSISTED NAVIGATION (Right) as a surgical intervention.  The patient's history has been reviewed, patient examined, no change in status, stable for surgery.  I have reviewed the patient's chart and labs.  Questions were answered to the patient's satisfaction.     Amandine Covino P Khia Dieterich

## 2024-07-09 NOTE — Plan of Care (Signed)

## 2024-07-09 NOTE — Transfer of Care (Signed)
 Immediate Anesthesia Transfer of Care Note  Patient: Holly Mclaughlin  Procedure(s) Performed: ARTHROPLASTY, KNEE, TOTAL, USING IMAGELESS COMPUTER-ASSISTED NAVIGATION (Right: Knee)  Patient Location: PACU  Anesthesia Type:Spinal  Level of Consciousness: awake, alert , and oriented  Airway & Oxygen Therapy: Patient Spontanous Breathing and Patient connected to nasal cannula oxygen  Post-op Assessment: Report given to RN and Post -op Vital signs reviewed and stable  Post vital signs: Reviewed and stable  Last Vitals:  Vitals Value Taken Time  BP 99/53 07/09/24 11:26  Temp    Pulse 80 07/09/24 11:29  Resp 17 07/09/24 11:29  SpO2 93 % 07/09/24 11:29  Vitals shown include unfiled device data.  Last Pain:  Vitals:   07/09/24 0624  TempSrc: Tympanic  PainSc: 0-No pain         Complications: There were no known notable events for this encounter.

## 2024-07-09 NOTE — TOC Progression Note (Signed)
 Transition of Care [facility]) - Progression Note    Patient Details  Name: Holly Mclaughlin MRN: [id number] Date of Birth: 08/11/1951  Transition of Care [facility]) CM/SW Contact  Marinda Cooks, RN Phone Number: 07/09/2024, 4:25 PM  Clinical Narrative:    Per chart review pt has [facility] services that were pre arranged with surgery office with [facility]. This CM spoke with pt introduced role and provided choice for recommended DME. Pt informed she did not have a preference for agency , DME coordinated with [facility]. Mardee modified to youth RW per PT recommendation . TOC will cont to follow dc planning / care coordination and update as applicable.      Barriers to Discharge: No Barriers Identified    Expected Discharge Plan and Services  Home with [facility] and DME recommended by PT.      DME Agency: [facility] Date DME Agency Contacted: 07/09/24 Time DME Agency Contacted: [id number] Representative spoke with at DME Agency: Mitch    Social Drivers of Health (SDOH) Interventions SDOH Screenings   Food Insecurity: No Food Insecurity (07/09/2024)  Housing: Low Risk  (07/09/2024)  Transportation Needs: No Transportation Needs (07/09/2024)  Utilities: Not At Risk (07/09/2024)  Financial Resource Strain: Low Risk  (07/03/2024)   Received from [facility]  Social Connections: Moderately Isolated (07/09/2024)  Tobacco Use: Low Risk  (07/09/2024)    Readmission Risk Interventions     No data to display

## 2024-07-09 NOTE — Evaluation (Signed)
 Physical Therapy Evaluation Patient Details Name: Holly Mclaughlin MRN: [id number] DOB: Nov 09, 1950 Today's Date: 07/09/2024  History of Present Illness  Pt is a 73 y/o F s/p R TKA on 07/09/24. PMH significant for arthritis, depression, hearing loss, HLD, IBS, GERD, RA.  Clinical Impression  Pt A&Ox4, pleasant and agreeable to participate in PT evaluation. At baseline, pt is IND with mobility/ADLs, denies hx of falls. Pt was met semi-supine in bed, able to perform bed mobility with minA, +2 to return to supine for safety. Pt performed 2 STS from EOB with minA to stabilize RW due to pt pulling on RW to stand. In standing, pt began to report feeling nauseous and appeared diaphoretic, had pt take 2 side-steps toward [facility] before returning to sitting. BP 127/47 (68) in sitting- RN informed via secure chat. Pt's symptoms reportedly improved with seated rest and return to supine. Pt was left semi-supine in bed at end of session, all needs in reach. Pt would benefit from skilled PT intervention to address listed deficits (see PT Problem List) and improve overall functional mobility status.         If plan is discharge home, recommend the following: A little help with walking and/or transfers;A little help with bathing/dressing/bathroom;Assistance with cooking/housework;Assist for transportation;Help with stairs or ramp for entrance   Can travel by private vehicle        Equipment Recommendations Rolling walker (2 wheels)  Recommendations for Other Services       Functional Status Assessment Patient has had a recent decline in their functional status and demonstrates the ability to make significant improvements in function in a reasonable and predictable amount of time.     Precautions / Restrictions Precautions Precautions: Knee;Fall Precaution Booklet Issued: Yes (comment) Recall of Precautions/Restrictions: Intact Restrictions Weight Bearing Restrictions Per Provider Order: Yes RLE Weight  Bearing Per Provider Order: Weight bearing as tolerated      Mobility  Bed Mobility Overal bed mobility: Needs Assistance Bed Mobility: Supine to Sit, Sit to Supine     Supine to sit: Min assist, HOB elevated, Used rails Sit to supine: Min assist, +2 for safety/equipment   General bed mobility comments: minA trunk assist to exit bed, minA +2 safety to return to supine    Transfers Overall transfer level: Needs assistance Equipment used: Rolling walker (2 wheels) Transfers: Sit to/from Stand Sit to Stand: Min assist           General transfer comment: 2 STS from EOB with minA for RW stabilization due to pt pulling from RW to stand    Ambulation/Gait Ambulation/Gait assistance: Contact guard assist, +2 safety/equipment Gait Distance (Feet): 2 Feet Assistive device: Rolling walker (2 wheels) Gait Pattern/deviations: Step-to pattern, Decreased weight shift to right Gait velocity: decreased     General Gait Details: Attempted to have pt side-step toward [facility], able to take 2 unsteady steps and was returned to EOB  Stairs            Wheelchair Mobility     Tilt Bed    Modified Rankin (Stroke Patients Only)       Balance Overall balance assessment: Needs assistance Sitting-balance support: Feet supported Sitting balance-Leahy Scale: Good Sitting balance - Comments: steady static and dynamic sitting   Standing balance support: Bilateral upper extremity supported, Reliant on assistive device for balance Standing balance-Leahy Scale: Fair Standing balance comment: heavy BUE support in standing  Pertinent Vitals/Pain Pain Assessment Pain Assessment: 0-10 Pain Score: 5  Pain Location: R knee Pain Descriptors / Indicators: Discomfort, Operative site guarding Pain Intervention(s): Limited activity within patient's tolerance, Monitored during session, Repositioned    Home Living Family/patient expects to be discharged  to:: Private residence Living Arrangements: Spouse/significant other Available Help at Discharge: Family;Available 24 hours/day Type of Home: House Home Access: Stairs to enter Entrance Stairs-Rails: Right;Left;Can reach both Entrance Stairs-Number of Steps: 7     Home Equipment: Toilet riser      Prior Function Prior Level of Function : Independent/Modified Independent             Mobility Comments: Pt reports being IND with mobility, no previous AD use, denies hx of falls ADLs Comments: IDN with ADLs     Extremity/Trunk Assessment   Upper Extremity Assessment Upper Extremity Assessment: Generalized weakness    Lower Extremity Assessment Lower Extremity Assessment: Generalized weakness       Communication   Communication Communication: Impaired Factors Affecting Communication: Hearing impaired    Cognition Arousal: Alert Behavior During Therapy: WFL for tasks assessed/performed   PT - Cognitive impairments: No apparent impairments                       PT - Cognition Comments: A&Ox4 Following commands: Intact       Cueing Cueing Techniques: Verbal cues, Tactile cues, Visual cues     General Comments      Exercises Total Joint Exercises Quad Sets: AROM, Right, 10 reps, Supine Goniometric ROM: R knee AROM 5-65 Other Exercises Other Exercises: BP assessed during session as pt began to c/o nausea upon standing and became diaphoretic: BP 127/47 (68) sitting EOB. Symptoms resolved with seated rest and pt was returned to supine   Assessment/Plan    PT Assessment Patient needs continued PT services  PT Problem List Decreased strength;Decreased range of motion;Decreased activity tolerance;Decreased balance;Decreased mobility;Decreased coordination;Decreased knowledge of use of DME;Pain       PT Treatment Interventions DME instruction;Gait training;Stair training;Functional mobility training;Therapeutic activities;Therapeutic exercise;Balance  training;Neuromuscular re-education;Cognitive remediation;Patient/family education    PT Goals (Current goals can be found in the Care Plan section)  Acute Rehab PT Goals Patient Stated Goal: to go home PT Goal Formulation: With patient Time For Goal Achievement: 07/23/24 Potential to Achieve Goals: Fair    Frequency BID     Co-evaluation               AM-PAC PT 6 Clicks Mobility  Outcome Measure Help needed turning from your back to your side while in a flat bed without using bedrails?: A Little Help needed moving from lying on your back to sitting on the side of a flat bed without using bedrails?: A Little Help needed moving to and from a bed to a chair (including a wheelchair)?: A Little Help needed standing up from a chair using your arms (e.g., wheelchair or bedside chair)?: A Little Help needed to walk in hospital room?: A Little Help needed climbing 3-5 steps with a railing? : A Lot 6 Click Score: 17    End of Session Equipment Utilized During Treatment: Gait belt Activity Tolerance: Patient limited by fatigue Patient left: in bed;with call bell/phone within reach;with bed alarm set;with SCD's reapplied Nurse Communication: Mobility status PT Visit Diagnosis: Unsteadiness on feet (R26.81);Muscle weakness (generalized) (M62.81);Difficulty in walking, not elsewhere classified (R26.2);Pain Pain - Right/Left: Right Pain - part of body: Knee    Time: 8541-8470 PT Time  Calculation (min) (ACUTE ONLY): 31 min   Charges:   PT Evaluation $PT Eval Moderate Complexity: 1 Mod PT Treatments $Therapeutic Activity: 8-22 mins PT General Charges $$ ACUTE PT VISIT: 1 Visit         Larue Drawdy, SPT

## 2024-07-09 NOTE — Op Note (Signed)
 OPERATIVE NOTE  DATE OF SURGERY:  07/09/2024  PATIENT NAME:  Holly Mclaughlin   DOB: 02-12-1951  MRN: [id number]  PRE-OPERATIVE DIAGNOSIS: Degenerative arthrosis of the right knee, primary  POST-OPERATIVE DIAGNOSIS:  Same  PROCEDURE:  Right total knee arthroplasty using computer-assisted navigation  SURGEON:  Lynwood SHAUNNA Mardee Mickey. M.D.  ASSISTANT:  Sidra Koyanagi, PA-C (present and scrubbed throughout the case, critical for assistance with exposure, retraction, instrumentation, and closure)  ANESTHESIA: spinal  ESTIMATED BLOOD LOSS: 50 mL  FLUIDS REPLACED: 750 mL of crystalloid  TOURNIQUET TIME: 85 minutes  DRAINS: 2 medium Hemovac drains  SOFT TISSUE RELEASES: Anterior cruciate ligament, posterior cruciate ligament, deep medial collateral ligament, patellofemoral ligament  IMPLANTS UTILIZED: DePuy Attune size 5N posterior stabilized femoral component (cemented), size 4 rotating platform tibial component (cemented), 32 mm medialized dome patella (cemented), and a 6 mm stabilized rotating platform polyethylene insert.  INDICATIONS FOR SURGERY: Holly Mclaughlin is a 73 y.o. year old female with a long history of progressive knee pain. X-rays demonstrated severe degenerative changes in tricompartmental fashion. The patient had not seen any significant improvement despite conservative nonsurgical intervention. After discussion of the risks and benefits of surgical intervention, the patient expressed understanding of the risks benefits and agree with plans for total knee arthroplasty.   The risks, benefits, and alternatives were discussed at length including but not limited to the risks of infection, bleeding, nerve injury, stiffness, blood clots, the need for revision surgery, cardiopulmonary complications, among others, and they were willing to proceed.  PROCEDURE IN DETAIL: The patient was brought into the operating room and, after adequate spinal anesthesia was achieved, a tourniquet  was placed on the patient's upper thigh. The patient's knee and leg were cleaned and prepped with alcohol and DuraPrep and draped in the usual sterile fashion. A timeout was performed as per usual protocol. The lower extremity was exsanguinated using an Esmarch, and the tourniquet was inflated to 300 mmHg. An anterior longitudinal incision was made followed by a standard mid vastus approach. The deep fibers of the medial collateral ligament were elevated in a subperiosteal fashion off of the medial flare of the tibia so as to maintain a continuous soft tissue sleeve. The patella was subluxed laterally and the patellofemoral ligament was incised. Inspection of the knee demonstrated severe degenerative changes with full-thickness loss of articular cartilage. Osteophytes were debrided using a rongeur. Anterior and posterior cruciate ligaments were excised. Two 4.0 mm Schanz pins were inserted in the femur and into the tibia for attachment of the array of trackers used for computer-assisted navigation. Hip center was identified using a circumduction technique. Distal landmarks were mapped using the computer. The distal femur and proximal tibia were mapped using the computer. The distal femoral cutting guide was positioned using computer-assisted navigation so as to achieve a 5 distal valgus cut. The femur was sized and it was felt that a size 5N femoral component was appropriate. A size 5 femoral cutting guide was positioned and the anterior cut was performed and verified using the computer. This was followed by completion of the posterior and chamfer cuts. Femoral cutting guide for the central box was then positioned in the center box cut was performed.  Attention was then directed to the proximal tibia. Medial and lateral menisci were excised. The extramedullary tibial cutting guide was positioned using computer-assisted navigation so as to achieve a 0 varus-valgus alignment and 3 posterior slope. The cut was  performed and verified using the computer. The proximal tibia  was sized and it was felt that a size 4 tibial tray was appropriate. Tibial and femoral trials were inserted followed by insertion of a 6 mm polyethylene insert. This allowed for excellent mediolateral soft tissue balancing both in flexion and in full extension. Finally, the patella was cut and prepared so as to accommodate a 32 mm medialized dome patella. A patella trial was placed and the knee was placed through a range of motion with excellent patellar tracking appreciated. The femoral trial was removed after debridement of posterior osteophytes. The central post-hole for the tibial component was reamed followed by insertion of a keel punch. Tibial trials were then removed. Cut surfaces of bone were irrigated with copious amounts of normal saline using pulsatile lavage and then suctioned dry. Polymethylmethacrylate cement was prepared in the usual fashion using a vacuum mixer. Cement was applied to the cut surface of the proximal tibia as well as along the undersurface of a size 4 rotating platform tibial component. Tibial component was positioned and impacted into place. Excess cement was removed using Personal assistant. Cement was then applied to the cut surfaces of the femur as well as along the posterior flanges of the size 5N femoral component. The femoral component was positioned and impacted into place. Excess cement was removed using Personal assistant. A 6 mm polyethylene trial was inserted and the knee was brought into full extension with steady axial compression applied. Finally, cement was applied to the backside of a 32 mm medialized dome patella and the patellar component was positioned and patellar clamp applied. Excess cement was removed using Personal assistant. After adequate curing of the cement, the tourniquet was deflated after a total tourniquet time of 85 minutes. Hemostasis was achieved using electrocautery. The knee was irrigated with  copious amounts of normal saline using pulsatile lavage followed by 450 ml of Surgiphor and then suctioned dry. 20 mL of 1.3% Exparel and 60 mL of 0.25% Marcaine in 40 mL of normal saline was injected along the posterior capsule, medial and lateral gutters, and along the arthrotomy site. A 6 mm stabilized rotating platform polyethylene insert was inserted and the knee was placed through a range of motion with excellent mediolateral soft tissue balancing appreciated and excellent patellar tracking noted. 2 medium drains were placed in the wound bed and brought out through separate stab incisions. The medial parapatellar portion of the incision was reapproximated using interrupted sutures of #1 Vicryl. Subcutaneous tissue was approximated in layers using first #0 Vicryl followed #2-0 Vicryl. The skin was approximated with skin staples. A sterile dressing was applied.  The patient tolerated the procedure well and was transported to the recovery room in stable condition.    Santiago Stenzel P. Brantley Wiley, Jr., M.D.

## 2024-07-09 NOTE — Anesthesia Preprocedure Evaluation (Signed)
 Anesthesia Evaluation  Patient identified by MRN, date of birth, ID band Patient awake    Reviewed: Allergy & Precautions, H&P , NPO status , Patient's Chart, lab work & pertinent test results, reviewed documented beta blocker date and time   History of Anesthesia Complications Negative for: history of anesthetic complications  Airway Mallampati: II  TM Distance: >3 FB Neck ROM: full    Dental  (+) Dental Advidsory Given, Missing, Teeth Intact Bridge on bottom right:   Pulmonary neg pulmonary ROS   Pulmonary exam normal breath sounds clear to auscultation       Cardiovascular Exercise Tolerance: Good negative cardio ROS Normal cardiovascular exam Rhythm:regular Rate:Normal     Neuro/Psych  PSYCHIATRIC DISORDERS  Depression    negative neurological ROS     GI/Hepatic Neg liver ROS,GERD  ,,  Endo/Other  negative endocrine ROS    Renal/GU negative Renal ROS  negative genitourinary   Musculoskeletal   Abdominal   Peds  Hematology negative hematology ROS (+)   Anesthesia Other Findings Past Medical History: No date: Arthritis No date: Atypical endometrial hyperplasia No date: Cancer (HCC)     Comment:  basal cell No date: Collagen vascular disease     Comment:  RA No date: Depression No date: Disc disease, degenerative, cervical No date: Fibroid No date: GERD (gastroesophageal reflux disease) No date: Hearing loss No date: Hyperlipidemia No date: Irritable bowel syndrome with constipation No date: Osteopenia of multiple sites No date: Pneumonia No date: Pure hypercholesterolemia   Reproductive/Obstetrics negative OB ROS                              Anesthesia Physical Anesthesia Plan  ASA: 2  Anesthesia Plan: Spinal   Post-op Pain Management:    Induction:   PONV Risk Score and Plan: 2 and Propofol  infusion, TIVA and Treatment may vary due to age or medical  condition  Airway Management Planned: Natural Airway and Simple Face Mask  Additional Equipment:   Intra-op Plan:   Post-operative Plan:   Informed Consent: I have reviewed the patients History and Physical, chart, labs and discussed the procedure including the risks, benefits and alternatives for the proposed anesthesia with the patient or authorized representative who has indicated his/her understanding and acceptance.     Dental Advisory Given  Plan Discussed with: Anesthesiologist, CRNA and Surgeon  Anesthesia Plan Comments:         Anesthesia Quick Evaluation

## 2024-07-09 NOTE — Anesthesia Procedure Notes (Addendum)
 Spinal  Patient location during procedure: OR Start time: 07/09/2024 7:28 AM Reason for block: surgical anesthesia Staffing Performed: anesthesiologist  Anesthesiologist: Dario Barter, MD Resident/CRNA: Bonnetta Jimmey SAUNDERS, CRNA Performed by: Bonnetta Jimmey SAUNDERS, CRNA Authorized by: Dario Barter, MD   Preanesthetic Checklist Completed: patient identified, IV checked, site marked, risks and benefits discussed, surgical consent, monitors and equipment checked, pre-op evaluation and timeout performed Spinal Block Patient position: sitting Prep: DuraPrep Patient monitoring: heart rate, cardiac monitor, continuous pulse ox and blood pressure Approach: midline Location: L3-4 Injection technique: single-shot Needle Needle type: Pencan  Needle gauge: 24 G Needle length: 9 cm Assessment Sensory level: T10 Events: CSF return Additional Notes Meticulous sterile technique used throughout (CHG prep, sterile gloves, sterile drape). Negative paresthesia. Negative blood return. Positive free-flowing CSF. Expiration date of kit checked and confirmed. Patient tolerated procedure well, without complications.

## 2024-07-09 NOTE — Progress Notes (Signed)
 Patient is not able to walk the distance required to go the bathroom, or he/she is unable to safely negotiate stairs required to access the bathroom.  A 3in1 BSC will alleviate this problem   Amenda Duclos P. Angie Fava M.D.

## 2024-07-10 ENCOUNTER — Encounter: Payer: Self-pay | Admitting: Orthopedic Surgery

## 2024-07-10 ENCOUNTER — Other Ambulatory Visit: Payer: Self-pay

## 2024-07-10 DIAGNOSIS — M1711 Unilateral primary osteoarthritis, right knee: Secondary | ICD-10-CM | POA: Diagnosis not present

## 2024-07-10 MED ORDER — OXYCODONE HCL 5 MG PO TABS
5.0000 mg | ORAL_TABLET | ORAL | 0 refills | Status: AC | PRN
  Filled 2024-07-10: qty 30, 5d supply, fill #0

## 2024-07-10 MED ORDER — TRAMADOL HCL 50 MG PO TABS
50.0000 mg | ORAL_TABLET | ORAL | 0 refills | Status: AC | PRN
  Filled 2024-07-10: qty 30, 3d supply, fill #0

## 2024-07-10 MED ORDER — ASPIRIN 81 MG PO CHEW: 81.0000 mg | CHEWABLE_TABLET | Freq: Two times a day (BID) | ORAL | Status: AC

## 2024-07-10 MED ORDER — CELECOXIB 200 MG PO CAPS
200.0000 mg | ORAL_CAPSULE | Freq: Two times a day (BID) | ORAL | 1 refills | Status: AC
  Filled 2024-07-10: qty 60, 30d supply, fill #0
  Filled 2024-08-09: qty 60, 30d supply, fill #1

## 2024-07-10 NOTE — Progress Notes (Signed)
 Physical Therapy Treatment Patient Details Name: Holly Mclaughlin MRN: [id number] DOB: Apr 29, 1951 Today's Date: 07/10/2024   History of Present Illness Pt is a 73 y/o F s/p R TKA on 07/09/24. PMH significant for arthritis, depression, hearing loss, HLD, IBS, GERD, RA.    PT Comments  Pt was supine in bed with HOB elevated ~ 30 degrees upon arrival.  She is A and O but HOH. Was able to exit bed with min assist. Stood to RW and tolerated ambulation ~ 140 ft prior to c/o severe onset on nausea and feeling clammy. BP 115/49(67). After a few minutes symptoms resolved and pt performed stairs with CGA only. Again pt c/o severe nausea. Author elected to push pt back to room in recliner. RN gave nausea meds. Author will return shortly to perform BID/2nd session in hopes symptoms resolved. MD/PA aware of concerns. Dc recs remain appropriate.    If plan is discharge home, recommend the following: A little help with walking and/or transfers;A little help with bathing/dressing/bathroom;Assistance with cooking/housework;Assist for transportation;Help with stairs or ramp for entrance     Equipment Recommendations  Rolling walker (2 wheels)       Precautions / Restrictions Precautions Precautions: Knee;Fall Precaution Booklet Issued: Yes (comment) Recall of Precautions/Restrictions: Intact Restrictions Weight Bearing Restrictions Per Provider Order: Yes RLE Weight Bearing Per Provider Order: Weight bearing as tolerated     Mobility  Bed Mobility Overal bed mobility: Needs Assistance Bed Mobility: Supine to Sit, Sit to Supine  Supine to sit: Min assist, HOB elevated, Used rails  General bed mobility comments: Min assist to safely exit R side of bed    Transfers Overall transfer level: Needs assistance Equipment used: Rolling walker (2 wheels) Transfers: Sit to/from Stand  General transfer comment: CGA for safety to stand form EOB and from recliner surface 2 x     Ambulation/Gait Ambulation/Gait assistance: Contact guard assist, Supervision Gait Distance (Feet): 120 Feet Assistive device: Rolling walker (2 wheels) Gait Pattern/deviations: Step-to pattern, Decreased weight shift to right Gait velocity: decreased  General Gait Details: pt was able to ambulate ~ 120 ft but becomes nauseous and clammy. prolonged seated rest prior to performing stairs. BP 115/49(67)   Stairs Stairs: Yes Stairs assistance: Contact guard assist Stair Management: Two rails, Step to pattern, Forwards Number of Stairs: 4 General stair comments: Pt performed ascending/descending 4 stair 1 x. again gets nauseous and required seated rest afterwards. Author elected to push pt back to room in recliner for safety.   Balance Overall balance assessment: Needs assistance Sitting-balance support: Feet supported Sitting balance-Leahy Scale: Good Sitting balance - Comments: steady static and dynamic sitting   Standing balance support: Bilateral upper extremity supported, Reliant on assistive device for balance Standing balance-Leahy Scale: Good Standing balance comment: heavy BUE support in standing     Communication Communication Communication: Impaired Factors Affecting Communication: Hearing impaired  Cognition Arousal: Alert Behavior During Therapy: WFL for tasks assessed/performed   PT - Cognitive impairments: No apparent impairments    PT - Cognition Comments: A&Ox4. HOH Following commands: Intact      Cueing Cueing Techniques: Verbal cues, Tactile cues, Visual cues  Exercises Total Joint Exercises Goniometric ROM: R knee AROM 4-70 Other Exercises Other Exercises: BP assessed during session as pt began to c/o nausea upon standing and became diaphoretic: BP 127/47 (68) sitting EOB. Symptoms resolved with seated rest and pt was returned to supine    General Comments General comments (skin integrity, edema, etc.): Pt did perform several AAROM  exercises. severely  limited ROM due to pain/ bandages      Pertinent Vitals/Pain Pain Assessment Pain Assessment: 0-10 Pain Score: 3  Pain Location: R knee Pain Descriptors / Indicators: Discomfort, Operative site guarding Pain Intervention(s): Limited activity within patient's tolerance, Monitored during session, Premedicated before session, Repositioned, Ice applied    Home Living Family/patient expects to be discharged to:: Private residence Living Arrangements: Spouse/significant other Available Help at Discharge: Family;Available 24 hours/day Type of Home: House Home Access: Stairs to enter Entrance Stairs-Rails: Right;Left;Can reach both Entrance Stairs-Number of Steps: 7   Home Layout: One level Home Equipment: Toilet riser Additional Comments: shower and tub/shower have steps to access within the bathroom- defer to [facility] OT to further recommend        PT Goals (current goals can now be found in the care plan section) Acute Rehab PT Goals Patient Stated Goal: to go home Progress towards PT goals: Progressing toward goals    Frequency    BID       AM-PAC PT 6 Clicks Mobility   Outcome Measure  Help needed turning from your back to your side while in a flat bed without using bedrails?: A Little Help needed moving from lying on your back to sitting on the side of a flat bed without using bedrails?: A Little Help needed moving to and from a bed to a chair (including a wheelchair)?: A Little Help needed standing up from a chair using your arms (e.g., wheelchair or bedside chair)?: A Little Help needed to walk in hospital room?: A Little Help needed climbing 3-5 steps with a railing? : A Little 6 Click Score: 18    End of Session Equipment Utilized During Treatment: Gait belt Activity Tolerance: Patient limited by fatigue Patient left: in bed;with call bell/phone within reach;with bed alarm set;with SCD's reapplied Nurse Communication: Mobility status PT Visit Diagnosis:  Unsteadiness on feet (R26.81);Muscle weakness (generalized) (M62.81);Difficulty in walking, not elsewhere classified (R26.2);Pain Pain - Right/Left: Right Pain - part of body: Knee     Time: 0753-0826 PT Time Calculation (min) (ACUTE ONLY): 33 min  Charges:    $Gait Training: 8-22 mins $Therapeutic Activity: 8-22 mins PT General Charges $$ ACUTE PT VISIT: 1 Visit                     Rankin Essex PTA 07/10/24, 11:52 AM

## 2024-07-10 NOTE — Progress Notes (Addendum)
 Physical Therapy Treatment Patient Details Name: Holly Mclaughlin MRN: [id number] DOB: [birth year] Today's Date: 07/10/2024   History of Present Illness Pt is a 73 y/o F s/p R TKA on 07/09/24. PMH significant for arthritis, depression, hearing loss, HLD, IBS, GERD, RA.    PT Comments  Author returned for BID/2nd session.  Pt/spouse endorse wanting to do stairs again prior to DC. Author elected to push pt to stairs in recliner and have pt ambulate back from stairs to her room. Pt successfully/safely performed stairs prior to ambulating ~ 80 ft and needing seated rest. Pt becomes severely nauseous again and sweating. Spouse endorses, She looks pale and doesn't look very well. BP 118/56. After several minutes symptoms resolved some but she still endorses feeling rough. MD and PA made aware. Acute PT will continue to follow and progress per current POC. DC recs are still appropriate.    If plan is discharge home, recommend the following: A little help with walking and/or transfers;A little help with bathing/dressing/bathroom;Assistance with cooking/housework;Assist for transportation;Help with stairs or ramp for entrance     Equipment Recommendations  Rolling walker (2 wheels) (youth/pediatric)      Precautions / Restrictions Precautions Precautions: Knee;Fall Precaution Booklet Issued: Yes (comment) Recall of Precautions/Restrictions: Intact Restrictions Weight Bearing Restrictions Per Provider Order: Yes RLE Weight Bearing Per Provider Order: Weight bearing as tolerated     Mobility  Bed Mobility Overal bed mobility: Needs Assistance Bed Mobility: Supine to Sit, Sit to Supine  Supine to sit: Min assist, HOB elevated, Used rails  General bed mobility comments: Pt was seated in recliner pre/post session. Spouse present.    Transfers Overall transfer level: Needs assistance Equipment used: Rolling walker (2 wheels) Transfers: Sit to/from Stand  General transfer comment: CGA for  safety to stand from to/ from recliner surface 2 x    Ambulation/Gait Ambulation/Gait assistance: Supervision Gait Distance (Feet): 80 Feet Assistive device: Rolling walker (2 wheels) Gait Pattern/deviations: Step-to pattern, Decreased weight shift to right Gait velocity: decreased  General Gait Details: Pt only tolerated ambulation ~ 80 ft after performing stairs before acute onset of dizziness/nausea/ pale and clammy sensations   Stairs Stairs: Yes Stairs assistance: Contact guard assist Stair Management: Two rails, Step to pattern, Forwards Number of Stairs: 4 General stair comments: Author pushed pt to stairs and pt performed prior to attempting to ambulate. Safely perform stairs with spouse present. pt continues to demonstrate strength and abilities to perform to simulate home entry/exit.   Balance Overall balance assessment: Needs assistance Sitting-balance support: Feet supported Sitting balance-Leahy Scale: Good Sitting balance - Comments: steady static and dynamic sitting   Standing balance support: Bilateral upper extremity supported, Reliant on assistive device for balance Standing balance-Leahy Scale: Good Standing balance comment: heavy BUE support in standing       Communication Communication Communication: Impaired Factors Affecting Communication: Hearing impaired  Cognition Arousal: Alert Behavior During Therapy: WFL for tasks assessed/performed   PT - Cognitive impairments: No apparent impairments      PT - Cognition Comments: A&Ox4. HOH Following commands: Intact      Cueing Cueing Techniques: Verbal cues, Tactile cues, Visual cues  Exercises Total Joint Exercises Goniometric ROM: R knee AROM 4-70 Other Exercises Other Exercises: BP assessed during session as pt began to c/o nausea upon standing and became diaphoretic: BP 127/47 (68) sitting EOB. Symptoms resolved with seated rest and pt was returned to supine    General Comments General comments  (skin integrity, edema, etc.): Pt/Pt's spouse voice  concersn about DCing home later this date. MD/PA made aware. RN aware pt continues to have symptoms with exertion.      Pertinent Vitals/Pain Pain Assessment Pain Assessment: 0-10 Pain Score: 5  Pain Location: R knee Pain Descriptors / Indicators: Discomfort, Operative site guarding Pain Intervention(s): Monitored during session, Limited activity within patient's tolerance, Premedicated before session, Repositioned, Ice applied    Home Living Family/patient expects to be discharged to:: Private residence Living Arrangements: Spouse/significant other Available Help at Discharge: Family;Available 24 hours/day Type of Home: House Home Access: Stairs to enter Entrance Stairs-Rails: Right;Left;Can reach both Entrance Stairs-Number of Steps: 7   Home Layout: One level Home Equipment: Toilet riser Additional Comments: shower and tub/shower have steps to access within the bathroom- defer to [facility] OT to further recommend    Prior Function            PT Goals (current goals can now be found in the care plan section) Acute Rehab PT Goals Patient Stated Goal: to go home Progress towards PT goals: Progressing toward goals    Frequency    BID       AM-PAC PT 6 Clicks Mobility   Outcome Measure  Help needed turning from your back to your side while in a flat bed without using bedrails?: A Little Help needed moving from lying on your back to sitting on the side of a flat bed without using bedrails?: A Little Help needed moving to and from a bed to a chair (including a wheelchair)?: A Little Help needed standing up from a chair using your arms (e.g., wheelchair or bedside chair)?: A Little Help needed to walk in hospital room?: A Little Help needed climbing 3-5 steps with a railing? : A Little 6 Click Score: 18    End of Session Equipment Utilized During Treatment: Gait belt Activity Tolerance: Patient limited by  fatigue Patient left: in bed;with call bell/phone within reach;with bed alarm set;with SCD's reapplied Nurse Communication: Mobility status PT Visit Diagnosis: Unsteadiness on feet (R26.81);Muscle weakness (generalized) (M62.81);Difficulty in walking, not elsewhere classified (R26.2);Pain Pain - Right/Left: Right Pain - part of body: Knee     Time: 1030-1053 PT Time Calculation (min) (ACUTE ONLY): 23 min  Charges:    $Gait Training: 8-22 mins $Therapeutic Activity: 8-22 mins PT General Charges $$ ACUTE PT VISIT: 1 Visit                    Rankin Essex PTA 07/10/24, 12:08 PM

## 2024-07-10 NOTE — Discharge Summary (Signed)
 Physician Discharge Summary  Subjective: 1 Day Post-Op Procedure(s) (LRB): ARTHROPLASTY, KNEE, TOTAL, USING IMAGELESS COMPUTER-ASSISTED NAVIGATION (Right) Patient reports pain as mild.   Patient seen in rounds with Dr. Mardee. Patient is well, and has had no acute complaints or problems Denies any CP, SOB, N/V, fevers or chills We will start therapy today.  Patient is ready to go home  Physician Discharge Summary  Patient ID: Holly Mclaughlin MRN: [id number] DOB/AGE: August 15, 1951 73 y.o.  Admit date: 07/09/2024 Discharge date: 07/10/2024  Admission Diagnoses:  Discharge Diagnoses:  Principal Problem:   History of total knee arthroplasty, right   Discharged Condition: good  Hospital Course: Patient presented to the hospital on 07/09/2024 for an elective right total knee arthroplasty performed by Dr. Mardee. Patient was given 1g of TXA and 2g of Ancef  prior to the procedure. she tolerated the procedure well without any complications. See procedural note below for details. Postoperatively, the patient did very well. she was able to pass PT protocols on post-op day one without any issues. JP drain was removed without any difficulty and was intact. she was able to void her bladder without any difficulty. Physical exam was unremarkable. she denies any SOB, CP, N/V, fevers or chills. Vital signs are stable. Patient is stable to discharge home.  PROCEDURE:  Right total knee arthroplasty using computer-assisted navigation   SURGEON:  Lynwood SHAUNNA Mardee Mickey. M.D.   ASSISTANT:  Sidra Koyanagi, PA-C (present and scrubbed throughout the case, critical for assistance with exposure, retraction, instrumentation, and closure)   ANESTHESIA: spinal   ESTIMATED BLOOD LOSS: 50 mL   FLUIDS REPLACED: 750 mL of crystalloid   TOURNIQUET TIME: 85 minutes   DRAINS: 2 medium Hemovac drains   SOFT TISSUE RELEASES: Anterior cruciate ligament, posterior cruciate ligament, deep medial collateral ligament,  patellofemoral ligament   IMPLANTS UTILIZED: DePuy Attune size 5N posterior stabilized femoral component (cemented), size 4 rotating platform tibial component (cemented), 32 mm medialized dome patella (cemented), and a 6 mm stabilized rotating platform polyethylene insert.  Treatments: none  Discharge Exam: Blood pressure 114/61, pulse 77, temperature 98.5 F (36.9 C), temperature source Temporal, resp. rate 16, height 4' 11 (1.499 m), weight 79.8 kg, SpO2 95%.   Disposition: home   Allergies as of 07/10/2024       Reactions   Codeine Rash   Prednisone Other (See Comments)   Did not like the way it made her feel    Lorazepam Itching        Medication List     STOP taking these medications    ibuprofen  600 MG tablet Commonly known as: ADVIL        TAKE these medications    acetaminophen  500 MG tablet Commonly known as: TYLENOL  Take 500 mg by mouth 2 (two) times daily as needed (pain.).   amoxicillin 500 MG capsule Commonly known as: AMOXIL Take 2,000 mg by mouth See admin instructions. Take 4 capsules (2000 mg) by mouth 1 hour prior to dental procedure.   aspirin 81 MG chewable tablet Chew 1 tablet (81 mg total) by mouth 2 (two) times daily.   atorvastatin 20 MG tablet Commonly known as: LIPITOR Take 20 mg by mouth at bedtime.   bisacodyl 5 MG EC tablet Commonly known as: DULCOLAX Take 20 mg by mouth daily as needed for severe constipation.   buPROPion 150 MG 24 hr tablet Commonly known as: WELLBUTRIN XL Take 150 mg by mouth in the morning.   celecoxib 200 MG capsule Commonly known as:  CELEBREX Take 1 capsule (200 mg total) by mouth 2 (two) times daily.   citalopram 20 MG tablet Commonly known as: CELEXA Take 20 mg by mouth in the morning.   [employer] Omeprazole 20 MG Tbdd disintegrating tablet Generic drug: omeprazole Take 20 mg by mouth daily as needed (indigestion/heartburn.).   docusate sodium  100 MG capsule Commonly known as: COLACE Take 300 mg  by mouth in the morning.   oxyCODONE  5 MG immediate release tablet Commonly known as: Oxy IR/ROXICODONE  Take 1 tablet (5 mg total) by mouth every 4 (four) hours as needed for moderate pain (pain score 4-6) (pain score 4-6).   Phazyme Ultimate 500 MG Caps Generic drug: Simethicone Take 500 mg by mouth daily as needed (gas/indigestion.).   traMADol 50 MG tablet Commonly known as: ULTRAM Take 1-2 tablets (50-100 mg total) by mouth every 4 (four) hours as needed for moderate pain (pain score 4-6).   Zantac 360 Max St 20 MG tablet Generic drug: famotidine  Take 20 mg by mouth 2 (two) times daily as needed for heartburn or indigestion.               Durable Medical Equipment  (From admission, onward)           Start     Ordered   07/09/24 1604  DME Walker rolling  Once       Comments: YOUTH SIZED WALKER  Question:  Patient needs a walker to treat with the following condition  Answer:  Total knee replacement status   07/09/24 1604   07/09/24 1311  DME Bedside commode  Once       Comments: Patient is not able to walk the distance required to go the bathroom, or he/she is unable to safely negotiate stairs required to access the bathroom.  A 3in1 BSC will alleviate this problem  Question:  Patient needs a bedside commode to treat with the following condition  Answer:  Total knee replacement status   07/09/24 1310            Follow-up Information     Charlene Debby BROCKS, PA-C Follow up on 07/24/2024.   Specialties: Orthopedic Surgery, Emergency Medicine Why: at 10:30am Contact information: [address] [place] [address] [phone]         Mardee Lynwood SQUIBB, MD Follow up on 08/21/2024.   Specialty: Orthopedic Surgery Why: at 3:00pm Contact information: [address] [facility] [place] [address] [phone]                 Signed: Sidra Koyanagi 07/10/2024, 8:07 AM   Objective: Vital signs in last 24 hours: Temp:  [96.9  F (36.1 C)-99.7 F (37.6 C)] 98.5 F (36.9 C) (10/21 0447) Pulse Rate:  [71-92] 77 (10/21 0447) Resp:  [12-20] 16 (10/21 0447) BP: (77-135)/(50-70) 114/61 (10/21 0447) SpO2:  [89 %-99 %] 95 % (10/21 0447)  Intake/Output from previous day:  Intake/Output Summary (Last 24 hours) at 07/10/2024 0807 Last data filed at 07/10/2024 0446 Gross per 24 hour  Intake 2108.17 ml  Output 515 ml  Net 1593.17 ml    Intake/Output this shift: No intake/output data recorded.  Labs: No results for input(s): HGB in the last 72 hours. No results for input(s): WBC, RBC, HCT, PLT in the last 72 hours. No results for input(s): NA, K, CL, CO2, BUN, CREATININE, GLUCOSE, CALCIUM  in the last 72 hours. No results for input(s): LABPT, INR in the last 72 hours.  EXAM: General -  Patient is Alert, Appropriate, and Oriented Extremity - Neurologically intact Neurovascular intact Sensation intact distally Intact pulses distally Dorsiflexion/Plantar flexion intact No cellulitis present Compartment soft Dressing - dressing C/D/I and no drainage Motor Function - intact, moving foot and toes well on exam. JP Drain pulled without difficulty. Intact  Assessment/Plan: 1 Day Post-Op Procedure(s) (LRB): ARTHROPLASTY, KNEE, TOTAL, USING IMAGELESS COMPUTER-ASSISTED NAVIGATION (Right) Procedure(s) (LRB): ARTHROPLASTY, KNEE, TOTAL, USING IMAGELESS COMPUTER-ASSISTED NAVIGATION (Right) Past Medical History:  Diagnosis Date   Arthritis    Atypical endometrial hyperplasia    Cancer (HCC)    basal cell   Collagen vascular disease    RA   Depression    Disc disease, degenerative, cervical    Fibroid    GERD (gastroesophageal reflux disease)    Hearing loss    Hyperlipidemia    Irritable bowel syndrome with constipation    Osteopenia of multiple sites    Pneumonia    Pure hypercholesterolemia    Principal Problem:   History of total knee arthroplasty, right  Estimated body  mass index is 35.55 kg/m as calculated from the following:   Height as of this encounter: 4' 11 (1.499 m).   Weight as of this encounter: 79.8 kg.  Patient will continue to work with physical therapy to pass postoperative PT protocols, ROM and strengthening   Discussed with the patient continuing to utilize Polar Care   Patient will use bone foam in 20-30 minute intervals   Patient will wear TED hose bilaterally to help prevent DVT and clot formation   Discussed the Aquacel bandage.  This bandage will stay in place 7 days postoperatively.  Can be replaced with honeycomb bandages that will be sent home with the patient   Discussed sending the patient home with tramadol and oxycodone  for as needed pain management.  Patient will also be sent home with Celebrex to help with swelling and inflammation.  Patient will take an 81 mg aspirin twice daily for DVT prophylaxis   JP drain removed without difficulty, intact   Weight-Bearing as tolerated to right leg   Patient will follow-up with [facility] orthopedics in 2 weeks for staple removal and reevaluation  Diet - Regular diet Follow up - in 2 weeks Activity - WBAT Disposition - Home Condition Upon Discharge - Good DVT Prophylaxis - Aspirin and TED hose  Fonda CHARLENA Koyanagi, PA-C Orthopaedic Surgery 07/10/2024, 8:07 AM

## 2024-07-10 NOTE — TOC CM/SW Note (Signed)
 Patient is not able to walk the distance required to go the bathroom, or he/she is unable to safely negotiate stairs required to access the bathroom.  A 3in1 BSC will alleviate this problem

## 2024-07-10 NOTE — Progress Notes (Signed)
 Physical Therapy Treatment Patient Details Name: Holly Mclaughlin MRN: [id number] DOB: September 07, 1951 Today's Date: 07/10/2024   History of Present Illness Pt is a 73 y/o F s/p R TKA on 07/09/24. PMH significant for arthritis, depression, hearing loss, HLD, IBS, GERD, RA.    PT Comments  Pt was long sitting in bed upon arrival. She remains A and O x 4. Agreeable to session and endorsing feeling better after eating some lunch and drinking a lot of water . Pt did not have symptoms of nausea or clammy like previous sessions this date. She demonstrated safe abilities to stand and ambulate ~ 200 ft. No LOB or safety concerns. Supportive spouse remains present and will be assisting pt at home.  Feel a lot better about taking her home now. Both endorse confidence in safe DC this afternoon.  Dc recs remain appropriate. Acute PT will continue to follow and progress per current POC.     If plan is discharge home, recommend the following: A little help with walking and/or transfers;A little help with bathing/dressing/bathroom;Assistance with cooking/housework;Assist for transportation;Help with stairs or ramp for entrance     Equipment Recommendations  Rolling walker (2 wheels)       Precautions / Restrictions Precautions Precautions: Knee;Fall Precaution Booklet Issued: Yes (comment) Recall of Precautions/Restrictions: Intact Restrictions Weight Bearing Restrictions Per Provider Order: Yes RLE Weight Bearing Per Provider Order: Weight bearing as tolerated     Mobility  Bed Mobility Overal bed mobility: Needs Assistance Bed Mobility: Supine to Sit  Supine to sit: Contact guard  General bed mobility comments: CGA for tactle cueing    Transfers Overall transfer level: Needs assistance Equipment used: Rolling walker (2 wheels) Transfers: Sit to/from Stand  General transfer comment: CGA for safety to stand from to/ from recliner surface 2 x    Ambulation/Gait Ambulation/Gait assistance:  Supervision Gait Distance (Feet): 200 Feet Assistive device: Rolling walker (2 wheels) Gait Pattern/deviations: Step-through pattern Gait velocity: decreased  General Gait Details: pt tolerated increased gait distance without LOB or symptoms of nausea or dizziness. MD/PA made aware. both pt and spouse endorse feeling much better about DCing later this date   Stairs Stairs: Yes Stairs assistance: Contact guard assist Stair Management: Two rails, Step to pattern, Forwards Number of Stairs: 4 General stair comments: Author pushed pt to stairs and pt performed prior to attempting to ambulate. Safely perform stairs with spouse present. pt continues to demonstrate strength and abilities to perform to simulate home entry/exit.    Balance Overall balance assessment: Needs assistance Sitting-balance support: Feet supported Sitting balance-Leahy Scale: Good Sitting balance - Comments: steady static and dynamic sitting   Standing balance support: Bilateral upper extremity supported, Reliant on assistive device for balance Standing balance-Leahy Scale: Good Standing balance comment: heavy BUE support in standing    Communication Communication Communication: Impaired Factors Affecting Communication: Hearing impaired  Cognition Arousal: Alert Behavior During Therapy: WFL for tasks assessed/performed   PT - Cognitive impairments: No apparent impairments   PT - Cognition Comments: A&Ox4. HOH Following commands: Intact      Cueing Cueing Techniques: Verbal cues, Tactile cues, Visual cues  Exercises Total Joint Exercises Goniometric ROM: R knee AROM 4-70 Other Exercises Other Exercises: BP assessed during session as pt began to c/o nausea upon standing and became diaphoretic: BP 127/47 (68) sitting EOB. Symptoms resolved with seated rest and pt was returned to supine    General Comments General comments (skin integrity, edema, etc.): Pt/Pt's spouse voice concersn about DCing home later  this date. MD/PA made aware. RN aware pt continues to have symptoms with exertion.      Pertinent Vitals/Pain Pain Assessment Pain Assessment: 0-10 Pain Score: 3  Pain Location: R knee Pain Descriptors / Indicators: Discomfort, Operative site guarding Pain Intervention(s): Limited activity within patient's tolerance, Monitored during session, Premedicated before session, Repositioned     PT Goals (current goals can now be found in the care plan section) Acute Rehab PT Goals Patient Stated Goal: to go home Progress towards PT goals: Progressing toward goals    Frequency    BID       AM-PAC PT 6 Clicks Mobility   Outcome Measure  Help needed turning from your back to your side while in a flat bed without using bedrails?: A Little Help needed moving from lying on your back to sitting on the side of a flat bed without using bedrails?: A Little Help needed moving to and from a bed to a chair (including a wheelchair)?: A Little Help needed standing up from a chair using your arms (e.g., wheelchair or bedside chair)?: A Little Help needed to walk in hospital room?: A Little Help needed climbing 3-5 steps with a railing? : A Little 6 Click Score: 18    End of Session Equipment Utilized During Treatment: Gait belt Activity Tolerance: Patient tolerated treatment well Patient left: in chair;with call bell/phone within reach;with chair alarm set;with family/visitor present Nurse Communication: Mobility status PT Visit Diagnosis: Unsteadiness on feet (R26.81);Muscle weakness (generalized) (M62.81);Difficulty in walking, not elsewhere classified (R26.2);Pain Pain - Right/Left: Right Pain - part of body: Knee     Time: 8598-8574 PT Time Calculation (min) (ACUTE ONLY): 24 min  Charges:    $Gait Training: 8-22 mins $Therapeutic Activity: 8-22 mins PT General Charges $$ ACUTE PT VISIT: 1 Visit                    Rankin Essex PTA 07/10/24, 2:39 PM

## 2024-07-10 NOTE — Evaluation (Signed)
 Occupational Therapy Evaluation Patient Details Name: Holly Mclaughlin MRN: [id number] DOB: Jul 16, 1951 Today's Date: 07/10/2024   History of Present Illness   Pt is a 73 y/o F s/p R TKA on 07/09/24. PMH significant for arthritis, depression, hearing loss, HLD, IBS, GERD, RA.     Clinical Impressions Patient was seen for OT evaluation this date. Prior to R TKA, patient was managing all ADLs and mobility without A but reports difficulty with LB dressing tasks due to R knee pain. Patient lives in single story home with 7 steps to enter with spouse able to provide 24/7 assist and support. OT provided education for R TKA post op care including AE/DME, ice care and ADL compensatory tasks, patient/spouse receptive to all education and feel comfortable discharging home. OT faciliated ADL, see below for details. Patient requires min VC and TC for sit<>stand transfers regarding hand placement with good return demo. Patient is currently requiring mod A for LB self care tasks.  Paient would benefit from skilled OT services to address noted impairments and functional limitations (see below for any additional details) in order to maximize safety and independence while minimizing future risk of falls, injury, and readmission.     If plan is discharge home, recommend the following:   A little help with walking and/or transfers;A little help with bathing/dressing/bathroom     Functional Status Assessment   Patient has had a recent decline in their functional status and demonstrates the ability to make significant improvements in function in a reasonable and predictable amount of time.     Equipment Recommendations   BSC/3in1     Recommendations for Other Services         Precautions/Restrictions   Precautions Precautions: Knee;Fall Precaution Booklet Issued: Yes (comment) Recall of Precautions/Restrictions: Intact Restrictions Weight Bearing Restrictions Per Provider Order: Yes RLE  Weight Bearing Per Provider Order: Weight bearing as tolerated     Mobility Bed Mobility                    Transfers Overall transfer level: Needs assistance Equipment used: Rolling walker (2 wheels) Transfers: Sit to/from Stand Sit to Stand: Contact guard assist, Supervision           General transfer comment: cues for hand placement      Balance Overall balance assessment: Needs assistance Sitting-balance support: Feet supported Sitting balance-Leahy Scale: Good Sitting balance - Comments: steady static and dynamic sitting   Standing balance support: Bilateral upper extremity supported, Reliant on assistive device for balance, During functional activity Standing balance-Leahy Scale: Fair                             ADL either performed or assessed with clinical judgement   ADL Overall ADL's : Needs assistance/impaired Eating/Feeding: Independent   Grooming: Wash/dry hands;Wash/dry face;Oral care;Supervision/safety;Contact guard assist;Standing   Upper Body Bathing: Supervision/ safety;Sitting   Lower Body Bathing: Moderate assistance;Sit to/from stand   Upper Body Dressing : Modified independent;Sitting   Lower Body Dressing: Moderate assistance;Sitting/lateral leans;Sit to/from stand Lower Body Dressing Details (indicate cue type and reason): A to don socks/shoes, A to pull up pants while standing Toilet Transfer: Supervision/safety;Contact guard assist;Rolling walker (2 wheels)           Functional mobility during ADLs: Contact guard assist       Vision         Perception         Praxis  Pertinent Vitals/Pain Pain Assessment Pain Assessment: 0-10 Pain Score: 4  Pain Location: R knee Pain Descriptors / Indicators: Discomfort, Operative site guarding Pain Intervention(s): Monitored during session     Extremity/Trunk Assessment Upper Extremity Assessment Upper Extremity Assessment: Overall WFL for tasks  assessed   Lower Extremity Assessment Lower Extremity Assessment: Defer to PT evaluation       Communication Communication Communication: Impaired Factors Affecting Communication: Hearing impaired   Cognition Arousal: Alert Behavior During Therapy: WFL for tasks assessed/performed Cognition: No apparent impairments                               Following commands: Intact       Cueing  General Comments   Cueing Techniques: Verbal cues;Tactile cues;Visual cues      Exercises     Shoulder Instructions      Home Living Family/patient expects to be discharged to:: Private residence Living Arrangements: Spouse/significant other Available Help at Discharge: Family;Available 24 hours/day Type of Home: House Home Access: Stairs to enter [employer] of Steps: 7 Entrance Stairs-Rails: Right;Left;Can reach both Home Layout: One level     Bathroom Shower/Tub: Tub only;Walk-in shower   Bathroom Toilet: Handicapped height Bathroom Accessibility: Yes   Home Equipment: Toilet riser   Additional Comments: shower and tub/shower have steps to access within the bathroom- defer to [facility] OT to further recommend      Prior Functioning/Environment Prior Level of Function : Independent/Modified Independent             Mobility Comments: Pt reports being IND with mobility, no previous AD use, denies hx of falls; managed ADLs with difficulty due to R knee pain but no A needed ADLs Comments: IDN with ADLs    OT Problem List: Decreased strength;Decreased activity tolerance   OT Treatment/Interventions: Self-care/ADL training      OT Goals(Current goals can be found in the care plan section)   Acute Rehab OT Goals Patient Stated Goal: to go home OT Goal Formulation: With patient/family Time For Goal Achievement: 07/24/24 Potential to Achieve Goals: Good   OT Frequency:  Other (comment) (dc today)    Co-evaluation              AM-PAC OT 6  Clicks Daily Activity     Outcome Measure Help from another person eating meals?: None Help from another person taking care of personal grooming?: None Help from another person toileting, which includes using toliet, bedpan, or urinal?: A Little Help from another person bathing (including washing, rinsing, drying)?: A Little Help from another person to put on and taking off regular upper body clothing?: A Little Help from another person to put on and taking off regular lower body clothing?: A Little 6 Click Score: 20   End of Session Equipment Utilized During Treatment: Gait belt;Rolling walker (2 wheels) Nurse Communication: Other (comment) (dressed/ready to go)  Activity Tolerance: Patient tolerated treatment well Patient left: in chair;with family/visitor present;with call bell/phone within reach  OT Visit Diagnosis: Unsteadiness on feet (R26.81);Other abnormalities of gait and mobility (R26.89);Repeated falls (R29.6)                Time: 0930-1001 OT Time Calculation (min): 31 min Charges:  OT General Charges $OT Visit: 1 Visit OT Evaluation $OT Eval Low Complexity: 1 Low OT Treatments $Self Care/Home Management : 23-37 mins  Rogers Clause, OT/L MSOT, 07/10/2024

## 2024-07-10 NOTE — Progress Notes (Signed)
 DISCHARGE NOTE:  Pt given discharge instructions and verbalized understanding. TED hose on both legs. Beds to meds medications sent with pt, along with 2 honeycomb dressings, BSC and walker. Pt wheeled to car by staff, husband providing transportation home.

## 2024-07-10 NOTE — Plan of Care (Signed)
   Problem: Activity: Goal: Ability to avoid complications of mobility impairment will improve Outcome: Progressing   Problem: Pain Management: Goal: Pain level will decrease with appropriate interventions Outcome: Progressing

## 2024-07-10 NOTE — Care Management Obs Status (Signed)
 MEDICARE OBSERVATION STATUS NOTIFICATION   Patient Details  Name: Holly Mclaughlin MRN: [id number] Date of Birth: [birth year]   Medicare Observation Status Notification Given:  Yes    Rojelio SHAUNNA Rattler 07/10/2024, 12:06 PM

## 2024-07-10 NOTE — Progress Notes (Signed)
 Subjective: 1 Day Post-Op Procedure(s) (LRB): ARTHROPLASTY, KNEE, TOTAL, USING IMAGELESS COMPUTER-ASSISTED NAVIGATION (Right) Patient reports pain as mild.   Patient seen in rounds with Dr. Mardee. Patient is well, and has had no acute complaints or problems Denies any CP, SOB, N/V, fevers or chills We will start therapy today.  Plan is to go Home after hospital stay.  Objective: Vital signs in last 24 hours: Temp:  [96.9 F (36.1 C)-99.7 F (37.6 C)] 98.5 F (36.9 C) (10/21 0447) Pulse Rate:  [71-92] 77 (10/21 0447) Resp:  [12-20] 16 (10/21 0447) BP: (77-135)/(50-70) 114/61 (10/21 0447) SpO2:  [89 %-99 %] 95 % (10/21 0447)  Intake/Output from previous day:  Intake/Output Summary (Last 24 hours) at 07/10/2024 0804 Last data filed at 07/10/2024 0446 Gross per 24 hour  Intake 2108.17 ml  Output 515 ml  Net 1593.17 ml    Intake/Output this shift: No intake/output data recorded.  Labs: No results for input(s): HGB in the last 72 hours. No results for input(s): WBC, RBC, HCT, PLT in the last 72 hours. No results for input(s): NA, K, CL, CO2, BUN, CREATININE, GLUCOSE, CALCIUM  in the last 72 hours. No results for input(s): LABPT, INR in the last 72 hours.  EXAM General - Patient is Alert, Appropriate, and Oriented Extremity - Neurologically intact Neurovascular intact Sensation intact distally Intact pulses distally Dorsiflexion/Plantar flexion intact No cellulitis present Compartment soft Dressing - dressing C/D/I and no drainage Motor Function - intact, moving foot and toes well on exam. JP Drain pulled without difficulty. Intact  Past Medical History:  Diagnosis Date   Arthritis    Atypical endometrial hyperplasia    Cancer (HCC)    basal cell   Collagen vascular disease    RA   Depression    Disc disease, degenerative, cervical    Fibroid    GERD (gastroesophageal reflux disease)    Hearing loss    Hyperlipidemia     Irritable bowel syndrome with constipation    Osteopenia of multiple sites    Pneumonia    Pure hypercholesterolemia     Assessment/Plan: 1 Day Post-Op Procedure(s) (LRB): ARTHROPLASTY, KNEE, TOTAL, USING IMAGELESS COMPUTER-ASSISTED NAVIGATION (Right) Principal Problem:   History of total knee arthroplasty, right  Estimated body mass index is 35.55 kg/m as calculated from the following:   Height as of this encounter: 4' 11 (1.499 m).   Weight as of this encounter: 79.8 kg. Advance diet Up with therapy  Patient will continue to work with physical therapy to pass postoperative PT protocols, ROM and strengthening  Discussed with the patient continuing to utilize Polar Care  Patient will use bone foam in 20-30 minute intervals  Patient will wear TED hose bilaterally to help prevent DVT and clot formation  Discussed the Aquacel bandage.  This bandage will stay in place 7 days postoperatively.  Can be replaced with honeycomb bandages that will be sent home with the patient  Discussed sending the patient home with tramadol and oxycodone  for as needed pain management.  Patient will also be sent home with Celebrex to help with swelling and inflammation.  Patient will take an 81 mg aspirin twice daily for DVT prophylaxis  JP drain removed without difficulty, intact  Weight-Bearing as tolerated to right leg  Patient will follow-up with [facility] in 2 weeks for staple removal and reevaluation  Fonda Koyanagi, PA-C [facility] 07/10/2024, 8:04 AM

## 2024-07-10 NOTE — TOC Transition Note (Signed)
 Transition of Care [facility]) - Discharge Note   Patient Details  Name: Holly Mclaughlin MRN: [id number] Date of Birth: [birth year]  [facility] CM/SW Contact:  Victory Jackquline RAMAN, RN Phone Number: 07/10/2024, 4:24 PM   Clinical Narrative:   Chart reviewed. RNCM, spoke with the patient at the bedside. I introduced myself, my role, and explained that discharge planning recommendations would be discussed. PT recommended Home with Home Health/PT and DME (RW and BSC). Patient states that the MD's office already set up Home Health PT and they are scheduled to come out to her house tomorrow. She is set up with [facility]. Patient is in agreement to use [employer] for DME. [employer] notified and equipment will be delivered to patient's room. Patient has discharge orders for today. No further concerns. RNCM Signing off.     Final next level of care: Home w Home Health Services Barriers to Discharge: No Barriers Identified   Patient Goals and CMS Choice            Discharge Placement                Patient to be transferred to facility by: Spouse Name of family member notified: Ozell Patient and family notified of of transfer: 07/10/24  Discharge Plan and Services Additional resources added to the After Visit Summary for                  DME Arranged: 3-N-1, Walker rolling DME Agency: [employer] Date DME Agency Contacted: 07/09/24 Time DME Agency Contacted: [phone] Representative spoke with at DME Agency: Mitch            Social Drivers of Health (SDOH) Interventions SDOH Screenings   Food Insecurity: No Food Insecurity (07/09/2024)  Housing: Low Risk  (07/09/2024)  Transportation Needs: No Transportation Needs (07/09/2024)  Utilities: Not At Risk (07/09/2024)  Financial Resource Strain: Low Risk  (07/03/2024)   Received from [facility]  Social Connections: Moderately Isolated (07/09/2024)  Tobacco Use: Low Risk  (07/09/2024)     Readmission  Risk Interventions     No data to display

## 2024-07-10 NOTE — Anesthesia Postprocedure Evaluation (Signed)
 Anesthesia Post Note  Patient: Holly Mclaughlin  Procedure(s) Performed: ARTHROPLASTY, KNEE, TOTAL, USING IMAGELESS COMPUTER-ASSISTED NAVIGATION (Right: Knee)  Patient location during evaluation: Short Stay Anesthesia Type: Spinal Level of consciousness: awake and alert and oriented Pain management: satisfactory to patient Vital Signs Assessment: post-procedure vital signs reviewed and stable Respiratory status: spontaneous breathing Cardiovascular status: stable Postop Assessment: patient able to bend at knees, no apparent nausea or vomiting, adequate PO intake and able to ambulate Anesthetic complications: no Comments: Has been able to void.   There were no known notable events for this encounter.   Last Vitals:  Vitals:   07/10/24 0014 07/10/24 0447  BP: (!) 102/50 114/61  Pulse: 82 77  Resp: 16 16  Temp: 36.9 C 36.9 C  SpO2: 95% 95%    Last Pain:  Vitals:   07/10/24 0601  TempSrc:   PainSc: 0-No pain                 Devonne Lalani Dyane

## 2024-07-26 ENCOUNTER — Other Ambulatory Visit: Payer: Self-pay | Admitting: Family Medicine

## 2024-07-26 DIAGNOSIS — Z1231 Encounter for screening mammogram for malignant neoplasm of breast: Secondary | ICD-10-CM

## 2024-07-28 ENCOUNTER — Ambulatory Visit
Admission: EM | Admit: 2024-07-28 | Discharge: 2024-07-28 | Disposition: A | Discharge status: Home / Self Care | Attending: Physician Assistant | Admitting: Physician Assistant

## 2024-07-28 ENCOUNTER — Encounter: Payer: Self-pay | Admitting: Emergency Medicine

## 2024-07-28 DIAGNOSIS — N3001 Acute cystitis with hematuria: Secondary | ICD-10-CM | POA: Insufficient documentation

## 2024-07-28 DIAGNOSIS — R300 Dysuria: Secondary | ICD-10-CM | POA: Diagnosis present

## 2024-07-28 LAB — POCT URINE DIPSTICK
Glucose, UA: NEGATIVE mg/dL
Ketones, POC UA: NEGATIVE mg/dL
Nitrite, UA: POSITIVE — AB
POC PROTEIN,UA: >=300 — AB
Spec Grav, UA: 1.02 (ref 1.010–1.025)
Urobilinogen, UA: 1 U/dL
pH, UA: 7.5 (ref 5.0–8.0)

## 2024-07-28 MED ORDER — CEPHALEXIN 500 MG PO CAPS: 500.0000 mg | ORAL_CAPSULE | Freq: Three times a day (TID) | ORAL | 0 refills | Status: AC

## 2024-07-28 NOTE — ED Triage Notes (Signed)
 Patient states that she noticed blood when she urinates that started yesterday.  Patient reports some urgency.  Patient denies fevers.

## 2024-07-28 NOTE — ED Provider Notes (Signed)
 [facility]    CSN: [id number] Arrival date & time: 07/28/24  1003      History   Chief Complaint Chief Complaint  Patient presents with   Hematuria    HPI Holly Mclaughlin is a 73 y.o. female presenting for dysuria, urinary frequency/urgency, bladder pressure, hematuria that began yesterday and worsened today.  She denies fever, fatigue, flank pain but has had a little bit of lower back pain.  No nausea/vomiting.  Recent knee replacement 3 weeks ago.  States she had a catheter at that time.  Unsure if that is related.  No other complaints.  HPI  Past Medical History:  Diagnosis Date   Arthritis    Atypical endometrial hyperplasia    Cancer (HCC)    basal cell   Collagen vascular disease    RA   Depression    Disc disease, degenerative, cervical    Fibroid    GERD (gastroesophageal reflux disease)    Hearing loss    Hyperlipidemia    Irritable bowel syndrome with constipation    Osteopenia of multiple sites    Pneumonia    Pure hypercholesterolemia     Patient Active Problem List   Diagnosis Date Noted   History of total knee arthroplasty, right 07/09/2024   Depression 07/08/2024   Hearing loss, central 07/08/2024   Primary osteoarthritis of right knee 05/20/2024   H/O: hysterectomy 03/07/2019   Osteopenia of right hip 03/07/2019   Atypical endometrial hyperplasia 09/22/2018   Postoperative state 09/22/2018   Pure hypercholesterolemia 08/01/2018   Arthritis 08/01/2018   Abdominal aortic atherosclerosis 08/01/2018   Disc disease, degenerative, cervical 08/28/2016   Irritable bowel syndrome with constipation 06/28/2014    Past Surgical History:  Procedure Laterality Date   ABDOMINAL HYSTERECTOMY     COLONOSCOPY     COLONOSCOPY WITH PROPOFOL  N/A 09/07/2021   Procedure: COLONOSCOPY WITH PROPOFOL ;  Surgeon: Onita Elspeth Sharper, DO;  Location: [facility];  Service: Gastroenterology;  Laterality: N/A;   DILATATION & CURETTAGE/HYSTEROSCOPY  WITH MYOSURE N/A 08/11/2018   Procedure: DILATATION & CURETTAGE/HYSTEROSCOPY WITH MYOSURE;  Surgeon: Schermerhorn, Debby PARAS, MD;  Location: [facility];  Service: Gynecology;  Laterality: N/A;   ESOPHAGOGASTRODUODENOSCOPY     JOINT REPLACEMENT Left 2014   knee   KNEE ARTHROPLASTY Right 07/09/2024   Procedure: ARTHROPLASTY, KNEE, TOTAL, USING IMAGELESS COMPUTER-ASSISTED NAVIGATION;  Surgeon: Mardee Lynwood SQUIBB, MD;  Location: [facility];  Service: Orthopedics;  Laterality: Right;   REPLACEMENT TOTAL KNEE Left 2014   SALPINGOOPHORECTOMY Bilateral 09/22/2018   Procedure: SALPINGO OOPHORECTOMY;  Surgeon: Schermerhorn, Debby PARAS, MD;  Location: [facility];  Service: Gynecology;  Laterality: Bilateral;   TONSILLECTOMY     TUBAL LIGATION     VAGINAL HYSTERECTOMY N/A 09/22/2018   Procedure: HYSTERECTOMY VAGINAL;  Surgeon: Schermerhorn, Debby PARAS, MD;  Location: [facility];  Service: Gynecology;  Laterality: N/A;    OB History   No obstetric history on file.      Home Medications    Prior to Admission medications   Medication Sig Start Date End Date Taking? Authorizing Provider  cephALEXin (KEFLEX) 500 MG capsule Take 1 capsule (500 mg total) by mouth 3 (three) times daily for 7 days. 07/28/24 08/04/24 Yes Arvis Jolan NOVAK, PA-C  acetaminophen  (TYLENOL ) 500 MG tablet Take 500 mg by mouth 2 (two) times daily as needed (pain.).    [provider]  aspirin 81 MG chewable tablet Chew 1 tablet (81 mg total) by mouth 2 (two) times daily. 07/10/24  Drake Chew, PA-C  atorvastatin (LIPITOR) 20 MG tablet Take 20 mg by mouth at bedtime. 07/20/18   [provider]  bisacodyl (DULCOLAX) 5 MG EC tablet Take 20 mg by mouth daily as needed for severe constipation.    [provider]  buPROPion (WELLBUTRIN XL) 150 MG 24 hr tablet Take 150 mg by mouth in the morning. 07/14/18   [provider]  celecoxib (CELEBREX) 200 MG capsule Take 1 capsule (200 mg total) by mouth 2 (two) times  daily. 07/10/24   Drake Chew, PA-C  citalopram (CELEXA) 20 MG tablet Take 20 mg by mouth in the morning.    [provider]  docusate sodium  (COLACE) 100 MG capsule Take 300 mg by mouth in the morning.    [provider]  famotidine  (ZANTAC 360 MAX ST) 20 MG tablet Take 20 mg by mouth 2 (two) times daily as needed for heartburn or indigestion.    [provider]  omeprazole ([employer] OMEPRAZOLE) 20 MG TBDD disintegrating tablet Take 20 mg by mouth daily as needed (indigestion/heartburn.).    [provider]  oxyCODONE  (OXY IR/ROXICODONE ) 5 MG immediate release tablet Take 1 tablet (5 mg total) by mouth every 4 (four) hours as needed for moderate pain (pain score 4-6) (pain score 4-6). 07/10/24   Drake Chew, PA-C  Simethicone (PHAZYME ULTIMATE) 500 MG CAPS Take 500 mg by mouth daily as needed (gas/indigestion.).    [provider]  traMADol (ULTRAM) 50 MG tablet Take 1-2 tablets (50-100 mg total) by mouth every 4 (four) hours as needed for moderate pain (pain score 4-6). 07/10/24   Drake Chew, PA-C    Family History Family History  Problem Relation Age of Onset   Heart attack Mother    Heart disease Mother    Diabetes type II Mother    Coronary artery disease Mother    Coronary artery disease Father    Heart attack Father    Stroke Father    Angina Father    Coronary artery disease Paternal Grandmother    Heart disease Paternal Grandmother    Breast cancer Paternal Grandmother 30   Breast cancer Paternal Aunt 84   Breast cancer Cousin        2 pat cousins   Breast cancer Cousin        mat cousin    Social History Social History   Tobacco Use   Smoking status: Never   Smokeless tobacco: Never  Vaping Use   Vaping status: Never Used  Substance Use Topics   Alcohol use: Yes    Comment: social   Drug use: Never     Allergies   Codeine, Prednisone, and Lorazepam   Review of Systems Review of Systems   Constitutional:  Negative for chills, fatigue and fever.  Gastrointestinal:  Positive for abdominal pain. Negative for diarrhea, nausea and vomiting.  Genitourinary:  Positive for dysuria, frequency and urgency. Negative for decreased urine volume, flank pain, hematuria, pelvic pain, vaginal bleeding, vaginal discharge and vaginal pain.  Musculoskeletal:  Positive for back pain.  Skin:  Negative for rash.     Physical Exam Triage Vital Signs ED Triage Vitals  Encounter Vitals Group     BP      Girls Systolic BP Percentile      Girls Diastolic BP Percentile      Boys Systolic BP Percentile      Boys Diastolic BP Percentile      Pulse      Resp  Temp      Temp src      SpO2      Weight      Height      Head Circumference      Peak Flow      Pain Score      Pain Loc      Pain Education      Exclude from Growth Chart    No data found.  Updated Vital Signs BP (!) 156/72 (BP Location: Left Arm)   Pulse 86   Temp 98.1 F (36.7 C) (Oral)   Resp 15   Ht 4' 11 (1.499 m)   Wt 177 lb (80.3 kg)   SpO2 97%   BMI 35.75 kg/m      Physical Exam Vitals and nursing note reviewed.  Constitutional:      General: She is not in acute distress.    Appearance: Normal appearance. She is not ill-appearing or toxic-appearing.  HENT:     Head: Normocephalic and atraumatic.  Eyes:     General: No scleral icterus.       Right eye: No discharge.        Left eye: No discharge.     Conjunctiva/sclera: Conjunctivae normal.  Cardiovascular:     Rate and Rhythm: Normal rate and regular rhythm.     Heart sounds: Normal heart sounds.  Pulmonary:     Effort: Pulmonary effort is normal. No respiratory distress.     Breath sounds: Normal breath sounds.  Abdominal:     Palpations: Abdomen is soft.     Tenderness: There is abdominal tenderness (suprapubic). There is no right CVA tenderness or left CVA tenderness.  Musculoskeletal:     Cervical back: Neck supple.  Skin:    General:  Skin is dry.  Neurological:     General: No focal deficit present.     Mental Status: She is alert. Mental status is at baseline.     Motor: No weakness.     Gait: Gait normal.  Psychiatric:        Mood and Affect: Mood normal.        Behavior: Behavior normal.      UC Treatments / Results  Labs (all labs ordered are listed, but only abnormal results are displayed) Labs Reviewed  POCT URINE DIPSTICK - Abnormal; Notable for the following components:      Result Value   Color, UA red (*)    Bilirubin, UA small (*)    Blood, UA large (*)    POC PROTEIN,UA >=300 (*)    Nitrite, UA Positive (*)    Leukocytes, UA Large (3+) (*)    All other components within normal limits  URINE CULTURE   Comprehensive metabolic panel Order: [id number]  Status: Final result     Next appt: None     Dx: Primary osteoarthritis of right knee   Test Result Released: No (inaccessible in MyChart)   0 Result Notes          Component Ref Range & Units (hover) 1 mo ago (06/28/24) 9 mo ago (10/28/23) 5 yr ago (09/22/18) 5 yr ago (09/19/18) 5 yr ago (08/03/18) 6 yr ago (07/06/18) 11 yr ago (11/15/12)  Sodium 140 136 138 139 139  135 Low  R  Potassium 4.0 4.2 3.9 3.9 4.1  4.6  Chloride 106 102 106 107 104  102 R  CO2 27 25 25 24 25  26  R  Glucose, Bld 86 103 High  CM 116 High  80 86  109 High  R  Comment: Glucose reference range applies only to samples taken after fasting for at least 8 hours.  BUN 14 13 12 13 15  13  R  Creatinine, Ser 0.89 0.71 0.72 0.67 0.72 0.79 0.81 R  Calcium  8.9 9.1 8.6 Low  9.3 9.1  8.2 Low  R  Total Protein 7.3 7.4       Albumin 4.1 4.4       AST 23 23       ALT 19 18       Alkaline Phosphatase 77 83       Total Bilirubin 0.4 0.5       GFR, Estimated >60 >60 CM       Comment: (NOTE) Calculated using the CKD-EPI Creatinine Equation (2021)  Anion gap 7 9 CM 7 CM 8 CM 10 CM  7 R  Comment: Performed at [facility], [address] [place], [place] [address]   Resulting Agency [facility] CONVERSION        Specimen Collected: 06/28/24 11:26 Last Resulted: 06/28/24 13:13   EKG   Radiology No results found.  Procedures Procedures (including critical care time)  Medications Ordered in UC Medications - No data to display  Initial Impression / Assessment and Plan / UC Course  I have reviewed the triage vital signs and the nursing notes.  Pertinent labs & imaging results that were available during my care of the patient were reviewed by me and considered in my medical decision making (see chart for details).   73 year old female presents for dysuria, frequency, urgency and hematuria since yesterday.  Urinalysis shows red discoloration of urine with large RBCs, protein, positive nitrites and large leukocytes.  Will send urine for culture.  Consistent with UTI.  Treating at this time with Keflex 500 mg 3 times daily x 7 days.  Reviewed increasing fluids and rest.  Discussed we may amend treatment based on culture result.  Discussed return and ER precautions.   Final Clinical Impressions(s) / UC Diagnoses   Final diagnoses:  Dysuria  Acute cystitis with hematuria     Discharge Instructions      UTI: Based on either symptoms or urinalysis, you may have a urinary tract infection. We will send the urine for culture and call with results in a few days. Begin antibiotics at this time. Your symptoms should be much improved over the next 2-3 days. Increase rest and fluid intake. If for some reason symptoms are worsening or not improving after a couple of days or the urine culture determines the antibiotics you are taking will not treat the infection, the antibiotics may be changed. Return or go to ER for fever, back pain, worsening urinary pain, discharge, increased blood in urine. May take Tylenol  or Motrin  OTC for pain relief or consider AZO if no contraindications       ED Prescriptions     Medication Sig Dispense Auth. Provider   cephALEXin (KEFLEX) 500 MG capsule Take 1 capsule (500 mg total) by mouth 3 (three) times daily for 7 days. 21 capsule Arvis Jolan NOVAK, PA-C      PDMP not reviewed this encounter.   Arvis Jolan NOVAK, PA-C 07/28/24 1039

## 2024-07-28 NOTE — Discharge Instructions (Signed)

## 2024-07-30 ENCOUNTER — Ambulatory Visit (HOSPITAL_COMMUNITY): Payer: Self-pay

## 2024-07-30 LAB — URINE CULTURE: Culture: 70000 — AB

## 2024-08-09 ENCOUNTER — Other Ambulatory Visit: Payer: Self-pay

## 2024-08-13 ENCOUNTER — Other Ambulatory Visit (HOSPITAL_COMMUNITY): Payer: Self-pay

## 2024-10-01 ENCOUNTER — Ambulatory Visit
Admission: RE | Admit: 2024-10-01 | Discharge: 2024-10-01 | Disposition: A | Discharge status: Home / Self Care | Source: Ambulatory Visit | Attending: Family Medicine | Admitting: Family Medicine

## 2024-10-01 DIAGNOSIS — Z1231 Encounter for screening mammogram for malignant neoplasm of breast: Secondary | ICD-10-CM | POA: Diagnosis present
